# Patient Record
Sex: Female | Born: 2000 | Hispanic: Yes | Marital: Married | Smoking: Never smoker
Health system: Southern US, Community
[De-identification: ages and names within clinical notes are randomized; demographics above are authoritative.]

## PROBLEM LIST (undated history)

## (undated) DIAGNOSIS — E119 Type 2 diabetes mellitus without complications: Secondary | ICD-10-CM

## (undated) DIAGNOSIS — I10 Essential (primary) hypertension: Secondary | ICD-10-CM

---

## 2019-06-01 ENCOUNTER — Other Ambulatory Visit: Payer: Self-pay

## 2019-06-01 ENCOUNTER — Emergency Department (HOSPITAL_BASED_OUTPATIENT_CLINIC_OR_DEPARTMENT_OTHER)
Admission: EM | Admit: 2019-06-01 | Discharge: 2019-06-01 | Disposition: A | Discharge status: Home / Self Care | Payer: Medicaid Other | Attending: Emergency Medicine | Admitting: Emergency Medicine

## 2019-06-01 ENCOUNTER — Encounter (HOSPITAL_BASED_OUTPATIENT_CLINIC_OR_DEPARTMENT_OTHER): Payer: Self-pay | Admitting: Emergency Medicine

## 2019-06-01 ENCOUNTER — Emergency Department (HOSPITAL_BASED_OUTPATIENT_CLINIC_OR_DEPARTMENT_OTHER): Payer: Medicaid Other

## 2019-06-01 DIAGNOSIS — Z20828 Contact with and (suspected) exposure to other viral communicable diseases: Secondary | ICD-10-CM | POA: Insufficient documentation

## 2019-06-01 DIAGNOSIS — Z20822 Contact with and (suspected) exposure to covid-19: Secondary | ICD-10-CM

## 2019-06-01 DIAGNOSIS — R0602 Shortness of breath: Secondary | ICD-10-CM

## 2019-06-01 DIAGNOSIS — B349 Viral infection, unspecified: Secondary | ICD-10-CM | POA: Insufficient documentation

## 2019-06-01 LAB — BASIC METABOLIC PANEL
Anion gap: 8 (ref 5–15)
BUN: 12 mg/dL (ref 6–20)
CO2: 23 mmol/L (ref 22–32)
Calcium: 9.1 mg/dL (ref 8.9–10.3)
Chloride: 107 mmol/L (ref 98–111)
Creatinine, Ser: 0.63 mg/dL (ref 0.44–1.00)
GFR calc Af Amer: >60 mL/min (ref >60)
GFR calc non Af Amer: >60 mL/min (ref >60)
Glucose, Bld: 89 mg/dL (ref 70–99)
Potassium: 3.8 mmol/L (ref 3.5–5.1)
Sodium: 138 mmol/L (ref 135–145)

## 2019-06-01 LAB — CBC WITH DIFFERENTIAL/PLATELET
Abs Immature Granulocytes: 0.04 10*3/uL (ref 0.00–0.07)
Basophils Absolute: 0 10*3/uL (ref 0.0–0.1)
Basophils Relative: 1 %
Eosinophils Absolute: 0.2 10*3/uL (ref 0.0–0.5)
Eosinophils Relative: 2 %
HCT: 39.5 % (ref 36.0–46.0)
Hemoglobin: 12.9 g/dL (ref 12.0–15.0)
Immature Granulocytes: 1 %
Lymphocytes Relative: 31 %
Lymphs Abs: 2.7 10*3/uL (ref 0.7–4.0)
MCH: 30.7 pg (ref 26.0–34.0)
MCHC: 32.7 g/dL (ref 30.0–36.0)
MCV: 94 fL (ref 80.0–100.0)
Monocytes Absolute: 0.5 10*3/uL (ref 0.1–1.0)
Monocytes Relative: 6 %
Neutro Abs: 5.3 10*3/uL (ref 1.7–7.7)
Neutrophils Relative %: 59 %
Platelets: 311 10*3/uL (ref 150–400)
RBC: 4.2 MIL/uL (ref 3.87–5.11)
RDW: 12.5 % (ref 11.5–15.5)
WBC: 8.7 10*3/uL (ref 4.0–10.5)
nRBC: 0 % (ref 0.0–0.2)

## 2019-06-01 LAB — URINALYSIS, ROUTINE W REFLEX MICROSCOPIC
Bilirubin Urine: NEGATIVE
Glucose, UA: NEGATIVE mg/dL
Hgb urine dipstick: NEGATIVE
Ketones, ur: NEGATIVE mg/dL
Leukocytes,Ua: NEGATIVE
Nitrite: NEGATIVE
Protein, ur: NEGATIVE mg/dL
Specific Gravity, Urine: 1.02 (ref 1.005–1.030)
pH: 7 (ref 5.0–8.0)

## 2019-06-01 LAB — SARS CORONAVIRUS 2 AG (30 MIN TAT): SARS Coronavirus 2 Ag: NEGATIVE

## 2019-06-01 LAB — PREGNANCY, URINE: Preg Test, Ur: NEGATIVE

## 2019-06-01 LAB — INFLUENZA PANEL BY PCR (TYPE A & B)
Influenza A By PCR: NEGATIVE
Influenza B By PCR: NEGATIVE

## 2019-06-01 NOTE — Discharge Instructions (Addendum)
You were seen in the emergency department for a few days of feeling unwell with shortness of breath body aches ringing in the ears fatigue chest pain.  You had blood work chest x-ray EKG that did not show any serious findings.  Your Covid testing was pending at time of discharge along with flu testing.  You will need to isolate until these tests are back and negative.  If either tests are positive you will need to isolate longer.

## 2019-06-01 NOTE — ED Triage Notes (Signed)
Generalized weakness, headache, "feels hot" today. States she feels like she did when she had covid earlier this year.

## 2019-06-01 NOTE — ED Provider Notes (Signed)
[facility] EMERGENCY DEPARTMENT Provider Note   CSN: [id number] Arrival date & time: 06/01/19  1157     History Chief Complaint  Patient presents with  . Weakness    Amanda Madden is a 18 y.o. female.  She is complaining of a few days of feeling not well.  Headache body aches cough shortness of breath chest pain ringing in the ears.  She said she had Covid earlier in the year.  Last menstrual period 1 month ago.  Boyfriend with acute illness also being evaluated in the emergency department.  The history is provided by the patient.  Influenza Presenting symptoms: cough, fatigue, fever, headache, myalgias and shortness of breath   Presenting symptoms: no diarrhea, no nausea, no rhinorrhea, no sore throat and no vomiting   Severity:  Moderate Onset quality:  Gradual Progression:  Unchanged Chronicity:  New Relieved by:  Nothing Worsened by:  Nothing Ineffective treatments:  None tried Associated symptoms: no chills, no mental status change and no neck stiffness   Risk factors: sick contacts        History reviewed. No pertinent past medical history.  There are no problems to display for this patient.   History reviewed. No pertinent surgical history.   OB History   No obstetric history on file.     No family history on file.  Social History   Tobacco Use  . Smoking status: Never Smoker  . Smokeless tobacco: Never Used  Substance Use Topics  . Alcohol use: Not Currently  . Drug use: Never    Home Medications Prior to Admission medications   Not on File    Allergies    Patient has no known allergies.  Review of Systems   Review of Systems  Constitutional: Positive for fatigue and fever. Negative for chills.  HENT: Positive for tinnitus. Negative for rhinorrhea and sore throat.   Eyes: Negative for visual disturbance.  Respiratory: Positive for cough and shortness of breath.   Cardiovascular: Positive for chest pain.  Gastrointestinal:  Negative for abdominal pain, diarrhea, nausea and vomiting.  Genitourinary: Negative for dysuria.  Musculoskeletal: Positive for myalgias. Negative for neck stiffness.  Skin: Negative for rash.  Neurological: Positive for headaches.    Physical Exam Updated Vital Signs BP 111/65 (BP Location: Left Arm)   Pulse 79   Temp 99.2 F (37.3 C) (Oral)   Resp 16   Ht 5\' 8"  (1.727 m)   Wt 113.4 kg   LMP 05/26/2019   SpO2 99%   BMI 38.01 kg/m   Physical Exam Vitals and nursing note reviewed.  Constitutional:      General: She is not in acute distress.    Appearance: She is well-developed.  HENT:     Head: Normocephalic and atraumatic.     Right Ear: Tympanic membrane, ear canal and external ear normal.     Left Ear: Tympanic membrane, ear canal and external ear normal.  Eyes:     Conjunctiva/sclera: Conjunctivae normal.  Cardiovascular:     Rate and Rhythm: Normal rate and regular rhythm.     Heart sounds: No murmur.  Pulmonary:     Effort: Pulmonary effort is normal. No respiratory distress.     Breath sounds: Normal breath sounds.  Abdominal:     Palpations: Abdomen is soft.     Tenderness: There is no abdominal tenderness.  Musculoskeletal:        General: Normal range of motion.     Cervical back: Neck supple.  Right lower leg: No edema.     Left lower leg: No edema.  Skin:    General: Skin is warm and dry.     Capillary Refill: Capillary refill takes less than 2 seconds.  Neurological:     General: No focal deficit present.     Mental Status: She is alert.     Sensory: No sensory deficit.     Motor: No weakness.     ED Results / Procedures / Treatments   Labs (all labs ordered are listed, but only abnormal results are displayed) Labs Reviewed  SARS CORONAVIRUS 2 AG (30 MIN TAT)  NOVEL CORONAVIRUS, NAA (HOSP ORDER, SEND-OUT TO REF LAB; TAT 18-24 HRS)  BASIC METABOLIC PANEL  CBC WITH DIFFERENTIAL/PLATELET  URINALYSIS, ROUTINE W REFLEX MICROSCOPIC    PREGNANCY, URINE  INFLUENZA PANEL BY PCR (TYPE A & B)    EKG EKG Interpretation  Date/Time:  Saturday June 01 2019 14:09:54 EST Ventricular Rate:  72 PR Interval:    QRS Duration: 93 QT Interval:  394 QTC Calculation: 432 R Axis:   101 Text Interpretation: Sinus arrhythmia Borderline right axis deviation Borderline T abnormalities, anterior leads No old tracing to compare Confirmed by Aletta Edouard [phone]) on 06/01/2019 2:13:03 PM   Radiology DG Chest Port 1 View  Result Date: 06/01/2019 CLINICAL DATA:  Cough. EXAM: PORTABLE CHEST 1 VIEW COMPARISON:  None. FINDINGS: 1429 hours. The lungs are clear without focal pneumonia, edema, pneumothorax or pleural effusion. The cardiopericardial silhouette is within normal limits for size. The visualized bony structures of the thorax are intact. Telemetry leads overlie the chest. IMPRESSION: No active disease. Electronically Signed   By: Misty Stanley M.D.   On: 06/01/2019 14:46    Procedures Procedures (including critical care time)  Medications Ordered in ED Medications - No data to display  ED Course  I have reviewed the triage vital signs and the nursing notes.  Pertinent labs & imaging results that were available during my care of the patient were reviewed by me and considered in my medical decision making (see chart for details).  Clinical Course as of May 31 1710  Sat May 31, 6761  3254 18 year old female here with multiple symptoms including headache body aches fever shortness of breath cough chest pain abdominal pain.  Low-grade temp here otherwise unremarkable vital signs.  Benign exam.  Has close sick contact also here getting evaluated.  Differential includes flu, Covid, viral syndrome, UTI, pneumonia.  EKG unremarkable.  Chest x-ray reviewed by me no gross infiltrates awaiting radiology reading.   [MB]    Clinical Course User Index [MB] Amanda Rasmussen, MD   MDM Rules/Calculators/A&P                     Amanda Madden was evaluated in Emergency Department on 06/01/2019 for the symptoms described in the history of present illness. She was evaluated in the context of the global COVID-19 pandemic, which necessitated consideration that the patient might be at risk for infection with the SARS-CoV-2 virus that causes COVID-19. Institutional protocols and algorithms that pertain to the evaluation of patients at risk for COVID-19 are in a state of rapid change based on information released by regulatory bodies including the CDC and federal and state organizations. These policies and algorithms were followed during the patient's care in the ED.  Final Clinical Impression(s) / ED Diagnoses Final diagnoses:  Acute viral syndrome  SOB (shortness of breath)  Person under investigation for COVID-19  Rx / DC Orders ED Discharge Orders    None       Terrilee Files, MD 06/01/19 1711

## 2019-06-03 LAB — NOVEL CORONAVIRUS, NAA (HOSP ORDER, SEND-OUT TO REF LAB; TAT 18-24 HRS): SARS-CoV-2, NAA: NOT DETECTED

## 2019-06-04 ENCOUNTER — Telehealth (HOSPITAL_COMMUNITY): Payer: Self-pay

## 2020-08-27 ENCOUNTER — Emergency Department (HOSPITAL_BASED_OUTPATIENT_CLINIC_OR_DEPARTMENT_OTHER): Payer: Medicaid Other

## 2020-08-27 ENCOUNTER — Emergency Department (HOSPITAL_BASED_OUTPATIENT_CLINIC_OR_DEPARTMENT_OTHER)
Admission: EM | Admit: 2020-08-27 | Discharge: 2020-08-27 | Disposition: A | Discharge status: Home / Self Care | Payer: Medicaid Other | Attending: Emergency Medicine | Admitting: Emergency Medicine

## 2020-08-27 ENCOUNTER — Other Ambulatory Visit: Payer: Self-pay

## 2020-08-27 ENCOUNTER — Encounter (HOSPITAL_BASED_OUTPATIENT_CLINIC_OR_DEPARTMENT_OTHER): Payer: Self-pay

## 2020-08-27 DIAGNOSIS — B349 Viral infection, unspecified: Secondary | ICD-10-CM

## 2020-08-27 DIAGNOSIS — R7989 Other specified abnormal findings of blood chemistry: Secondary | ICD-10-CM

## 2020-08-27 DIAGNOSIS — R531 Weakness: Secondary | ICD-10-CM

## 2020-08-27 DIAGNOSIS — R7401 Elevation of levels of liver transaminase levels: Secondary | ICD-10-CM | POA: Insufficient documentation

## 2020-08-27 DIAGNOSIS — R69 Illness, unspecified: Secondary | ICD-10-CM | POA: Diagnosis not present

## 2020-08-27 DIAGNOSIS — E119 Type 2 diabetes mellitus without complications: Secondary | ICD-10-CM | POA: Diagnosis not present

## 2020-08-27 DIAGNOSIS — Z7984 Long term (current) use of oral hypoglycemic drugs: Secondary | ICD-10-CM | POA: Insufficient documentation

## 2020-08-27 DIAGNOSIS — R109 Unspecified abdominal pain: Secondary | ICD-10-CM | POA: Insufficient documentation

## 2020-08-27 DIAGNOSIS — Z20822 Contact with and (suspected) exposure to covid-19: Secondary | ICD-10-CM | POA: Insufficient documentation

## 2020-08-27 DIAGNOSIS — R5383 Other fatigue: Secondary | ICD-10-CM | POA: Diagnosis present

## 2020-08-27 HISTORY — DX: Type 2 diabetes mellitus without complications: E11.9

## 2020-08-27 LAB — COMPREHENSIVE METABOLIC PANEL
ALT: 192 U/L — ABNORMAL HIGH (ref 0–44)
AST: 133 U/L — ABNORMAL HIGH (ref 15–41)
Albumin: 3.7 g/dL (ref 3.5–5.0)
Alkaline Phosphatase: 51 U/L (ref 38–126)
Anion gap: 11 (ref 5–15)
BUN: 8 mg/dL (ref 6–20)
CO2: 21 mmol/L — ABNORMAL LOW (ref 22–32)
Calcium: 9.2 mg/dL (ref 8.9–10.3)
Chloride: 102 mmol/L (ref 98–111)
Creatinine, Ser: 0.89 mg/dL (ref 0.44–1.00)
GFR, Estimated: >60 mL/min (ref >60)
Glucose, Bld: 107 mg/dL — ABNORMAL HIGH (ref 70–99)
Potassium: 3.5 mmol/L (ref 3.5–5.1)
Sodium: 134 mmol/L — ABNORMAL LOW (ref 135–145)
Total Bilirubin: 0.2 mg/dL — ABNORMAL LOW (ref 0.3–1.2)
Total Protein: 8.6 g/dL — ABNORMAL HIGH (ref 6.5–8.1)

## 2020-08-27 LAB — URINALYSIS, ROUTINE W REFLEX MICROSCOPIC
Bilirubin Urine: NEGATIVE
Glucose, UA: NEGATIVE mg/dL
Ketones, ur: 15 mg/dL — AB
Leukocytes,Ua: NEGATIVE
Nitrite: NEGATIVE
Protein, ur: NEGATIVE mg/dL
Specific Gravity, Urine: 1.015 (ref 1.005–1.030)
pH: 6 (ref 5.0–8.0)

## 2020-08-27 LAB — URINALYSIS, MICROSCOPIC (REFLEX)

## 2020-08-27 LAB — RESP PANEL BY RT-PCR (FLU A&B, COVID) ARPGX2
Influenza A by PCR: NEGATIVE
Influenza B by PCR: NEGATIVE
SARS Coronavirus 2 by RT PCR: NEGATIVE

## 2020-08-27 LAB — CBC WITH DIFFERENTIAL/PLATELET
Abs Immature Granulocytes: 0.04 10*3/uL (ref 0.00–0.07)
Basophils Absolute: 0 10*3/uL (ref 0.0–0.1)
Basophils Relative: 0 %
Eosinophils Absolute: 0 10*3/uL (ref 0.0–0.5)
Eosinophils Relative: 0 %
HCT: 35.2 % — ABNORMAL LOW (ref 36.0–46.0)
Hemoglobin: 11.9 g/dL — ABNORMAL LOW (ref 12.0–15.0)
Immature Granulocytes: 1 %
Lymphocytes Relative: 16 %
Lymphs Abs: 1.2 10*3/uL (ref 0.7–4.0)
MCH: 30.4 pg (ref 26.0–34.0)
MCHC: 33.8 g/dL (ref 30.0–36.0)
MCV: 89.8 fL (ref 80.0–100.0)
Monocytes Absolute: 0.9 10*3/uL (ref 0.1–1.0)
Monocytes Relative: 12 %
Neutro Abs: 5.6 10*3/uL (ref 1.7–7.7)
Neutrophils Relative %: 71 %
Platelets: 309 10*3/uL (ref 150–400)
RBC: 3.92 MIL/uL (ref 3.87–5.11)
RDW: 13.3 % (ref 11.5–15.5)
WBC: 7.8 10*3/uL (ref 4.0–10.5)
nRBC: 0 % (ref 0.0–0.2)

## 2020-08-27 LAB — LIPASE, BLOOD: Lipase: 27 U/L (ref 11–51)

## 2020-08-27 LAB — CBG MONITORING, ED: Glucose-Capillary: 103 mg/dL — ABNORMAL HIGH (ref 70–99)

## 2020-08-27 LAB — PREGNANCY, URINE: Preg Test, Ur: NEGATIVE

## 2020-08-27 LAB — LACTIC ACID, PLASMA: Lactic Acid, Venous: 1.3 mmol/L (ref 0.5–1.9)

## 2020-08-27 MED ORDER — ACETAMINOPHEN 325 MG PO TABS
650.0000 mg | ORAL_TABLET | Freq: Once | ORAL | Status: AC
  Administered 2020-08-27: 650 mg via ORAL
  Filled 2020-08-27: qty 2

## 2020-08-27 MED ORDER — SODIUM CHLORIDE 0.9 % IV BOLUS
1000.0000 mL | Freq: Once | INTRAVENOUS | Status: AC
  Administered 2020-08-27: 1000 mL via INTRAVENOUS

## 2020-08-27 MED ORDER — ONDANSETRON HCL 4 MG/2ML IJ SOLN
4.0000 mg | Freq: Once | INTRAMUSCULAR | Status: AC
  Administered 2020-08-27: 4 mg via INTRAVENOUS
  Filled 2020-08-27: qty 2

## 2020-08-27 MED ORDER — ONDANSETRON 4 MG PO TBDP: 4.0000 mg | ORAL_TABLET | Freq: Three times a day (TID) | ORAL | 0 refills | Status: AC | PRN

## 2020-08-27 NOTE — ED Provider Notes (Signed)
[facility] EMERGENCY DEPARTMENT Provider Note   CSN: [id number] Arrival date & time: 08/27/20  1134     History Chief Complaint  Patient presents with  . Fatigue  . Pain    Amanda Madden is a 20 y.o. female with PMHx diabetes who presents to the ED today with complaints of generalized fatigue for the past couple of days.  Per chart review patient was at [facility] ED yesterday with complaints of fatigue, body aches, headache, chest pain, shortness of breath, dizziness.  She had a full work-up done at that time including a CTA of her chest which did not show any findings to suggest PE.  CT abdomen and pelvis without acute findings as well as a pelvic ultrasound without acute findings.  She was discharged home at that time.  Husband reports that patient was in the shower today when he went and checked on her and she seemed much more fatigued than normal and just generally unwell prompting him to bring her back to the ED.  He reports that she began feeling bad yesterday.  They took an at home Covid test which was negative and then decided to go get their Moderna vaccines for the first time yesterday.  Patient had a low-grade fever yesterday however is febrile today at 103.1.  Patient continues to complain of nausea however no vomiting.  No diarrhea, last normal bowel movement earlier today.   The history is provided by the patient, the spouse and medical records.       Past Medical History:  Diagnosis Date  . Diabetes mellitus without complication (HCC)     There are no problems to display for this patient.   History reviewed. No pertinent surgical history.   OB History   No obstetric history on file.     History reviewed. No pertinent family history.  Social History   Tobacco Use  . Smoking status: Never Smoker  Substance Use Topics  . Alcohol use: Never  . Drug use: Never    Home Medications Prior to Admission medications   Medication Sig Start Date End  Date Taking? Authorizing Provider  metFORMIN (GLUCOPHAGE) 500 MG tablet Take by mouth daily with breakfast.   Yes [provider]  ondansetron (ZOFRAN ODT) 4 MG disintegrating tablet Take 1 tablet (4 mg total) by mouth every 8 (eight) hours as needed for nausea or vomiting. 08/27/20  Yes Tanda Rockers, PA-C    Allergies    Patient has no known allergies.  Review of Systems   Review of Systems  Constitutional: Positive for chills, fatigue and fever.  Respiratory: Positive for cough and shortness of breath.   Gastrointestinal: Positive for abdominal pain and nausea.  Neurological: Positive for weakness (generalized weakness) and headaches.  All other systems reviewed and are negative.   Physical Exam Updated Vital Signs BP 122/84 (BP Location: Left Arm)   Pulse (!) 122   Temp (!) 103.1 F (39.5 C) (Oral)   Resp 16   Ht 5\' 8"  (1.727 m)   Wt 116.1 kg   LMP 08/23/2020   SpO2 98%   BMI 38.92 kg/m   Physical Exam Vitals and nursing note reviewed.  Constitutional:      Appearance: She is obese. She is ill-appearing. She is not diaphoretic.     Comments: Appears ill and fatigued. Speaking very softly/faintly however A&O x 4  HENT:     Head: Normocephalic and atraumatic.  Eyes:     Extraocular Movements: Extraocular movements intact.  Conjunctiva/sclera: Conjunctivae normal.     Pupils: Pupils are equal, round, and reactive to light.  Cardiovascular:     Rate and Rhythm: Normal rate and regular rhythm.     Pulses: Normal pulses.  Pulmonary:     Effort: Pulmonary effort is normal.     Breath sounds: Normal breath sounds. No wheezing, rhonchi or rales.  Abdominal:     Palpations: Abdomen is soft.     Tenderness: There is abdominal tenderness. There is no guarding or rebound.     Comments: Soft, diffuse abdominal TTP, +BS throughout, no r/g/r, neg murphy's, neg mcburney's, no CVA TTP  Musculoskeletal:     Cervical back: Neck supple.  Skin:    General: Skin is  warm and dry.  Neurological:     Mental Status: She is alert.     ED Results / Procedures / Treatments   Labs (all labs ordered are listed, but only abnormal results are displayed) Labs Reviewed  COMPREHENSIVE METABOLIC PANEL - Abnormal; Notable for the following components:      Result Value   Sodium 134 (*)    CO2 21 (*)    Glucose, Bld 107 (*)    Total Protein 8.6 (*)    AST 133 (*)    ALT 192 (*)    Total Bilirubin 0.2 (*)    All other components within normal limits  CBC WITH DIFFERENTIAL/PLATELET - Abnormal; Notable for the following components:   Hemoglobin 11.9 (*)    HCT 35.2 (*)    All other components within normal limits  URINALYSIS, ROUTINE W REFLEX MICROSCOPIC - Abnormal; Notable for the following components:   Hgb urine dipstick SMALL (*)    Ketones, ur 15 (*)    All other components within normal limits  URINALYSIS, MICROSCOPIC (REFLEX) - Abnormal; Notable for the following components:   Bacteria, UA FEW (*)    All other components within normal limits  CBG MONITORING, ED - Abnormal; Notable for the following components:   Glucose-Capillary 103 (*)    All other components within normal limits  RESP PANEL BY RT-PCR (FLU A&B, COVID) ARPGX2  LACTIC ACID, PLASMA  PREGNANCY, URINE  LIPASE, BLOOD    EKG EKG Interpretation  Date/Time:  Thursday August 27 2020 11:45:35 EDT Ventricular Rate:  117 PR Interval:    QRS Duration: 87 QT Interval:  291 QTC Calculation: 406 R Axis:   117 Text Interpretation: Sinus tachycardia Probable right ventricular hypertrophy Borderline T abnormalities, diffuse leads No previous ECGs available Confirmed by Vanetta Mulders [phone]) on 08/27/2020 1:48:20 PM   Radiology DG Chest Portable 1 View  Result Date: 08/27/2020 CLINICAL DATA:  Short of breath and fever EXAM: PORTABLE CHEST 1 VIEW COMPARISON:  None. FINDINGS: The heart size and mediastinal contours are within normal limits. Both lungs are clear. The visualized skeletal  structures are unremarkable. IMPRESSION: No active disease. Electronically Signed   By: Marlan Palau M.D.   On: 08/27/2020 12:51   US Abdomen Limited RUQ (LIVER/GB)  Result Date: 08/27/2020 CLINICAL DATA:  Nausea and vomiting with abdominal pain x3 days EXAM: ULTRASOUND ABDOMEN LIMITED RIGHT UPPER QUADRANT COMPARISON:  None. FINDINGS: Gallbladder: No gallstones or wall thickening visualized. No sonographic Murphy sign noted by sonographer. Common bile duct: Diameter: 2 mm Liver: No focal lesion identified. Within normal limits in parenchymal echogenicity. Portal vein is patent on color Doppler imaging with normal direction of blood flow towards the liver. Other: None. IMPRESSION: Unremarkable right upper quadrant ultrasound. Electronically Signed  By: Maudry Mayhew MD   On: 08/27/2020 16:16    Procedures Procedures   Medications Ordered in ED Medications  acetaminophen (TYLENOL) tablet 650 mg (650 mg Oral Given 08/27/20 1256)  ondansetron (ZOFRAN) injection 4 mg (4 mg Intravenous Given 08/27/20 1241)  sodium chloride 0.9 % bolus 1,000 mL (0 mLs Intravenous Stopped 08/27/20 1357)    ED Course  I have reviewed the triage vital signs and the nursing notes.  Pertinent labs & imaging results that were available during my care of the patient were reviewed by me and considered in my medical decision making (see chart for details).    MDM Rules/Calculators/A&P                          20 year old female who presents to the ED today with complaint of generalized fatigue and weakness for the past 2 days.  She was feeling ill yesterday however then decided to go and receive her first maternal vaccine.  She went to [facility] ED yesterday and had a full work-up including lab work as well as troponin, D-dimer, CBC, lipase, CMP, CTA chest, CT abdomen and pelvis, pelvic ultrasound which were all negative.  She began feeling more ill today and seemed "altered" to her husband prompting him to bring her to  the ED today.  She was found to be febrile at 103.1 as well as tachycardic at 127.  Nontachypneic.  Blood pressure stable at 128/87 and satting 100% on room air.  She is alert and oriented x4 on my exam.  Equal strength to BUE and BLEs. She does appear very fatigued and is speaking in short sentences.  She is speaking very softly and needs to be prompted to be able to talk otherwise her spouse will talk over her.  She denies any sore throat or difficulty swallowing.  She has diffuse abdominal tenderness palpation however does state her body hurts all over.  She has had some nausea.  No vomiting or diarrhea.  Will provide Tylenol and fluids for symptomatic relief at this time.  We will plan to swab for Covid.  Will obtain lab work as well to assess for any change since her ED visit yesterday.  Chest x-ray was ordered prior to being seen which does not show any acute findings.  CBC without leukocytosis at 7.8.  Hemoglobin stable at 11.9. CMP with a sodium of 134.  Glucose 107, bicarb 21.  No gap.  AST and ALT are significantly elevated today at 133/192. (122/82 yesterday).  Patient if she is having LFT elevation secondary to possible Covid infection.  There were no gallstones appreciated on her CT abdomen and pelvis yesterday. Lipase not ordered initially, will plan to obtain today. Lactic acid within normal limits at 1.3.  Covid and flu test have all returned negative.  Given elevated LFTs compared to yesterday with fever will plan for right upper quadrant ultrasound to assess for any gallbladder etiology.  Concern for possible acute cholecystitis at this time.   RUQ ultrasound negative for acute findings  Remainder of workup reassuring. I am still suspicious for COVID if though her test is negative. She may also have a viral infection compounded ontop of vaccine reaction from yesterday. Advised pt to self isolate for the next 5 days and to follow up with PCP for same. Will discharge with zofran for  nausea. Have encouraged increasing water intake to stay hydrated and to take Tylenol as needed for fevers. Pt  instructed to return to the ED for any worsening symptoms. She is in agreement with plan and stable for discharge home.   This note was prepared using Dragon voice recognition software and may include unintentional dictation errors due to the inherent limitations of voice recognition software.  Amanda Madden Emley was evaluated in Emergency Department on 08/27/2020 for the symptoms described in the history of present illness. She was evaluated in the context of the global COVID-19 pandemic, which necessitated consideration that the patient might be at risk for infection with the SARS-CoV-2 virus that causes COVID-19. Institutional protocols and algorithms that pertain to the evaluation of patients at risk for COVID-19 are in a state of rapid change based on information released by regulatory bodies including the CDC and federal and state organizations. These policies and algorithms were followed during the patient's care in the ED.   Final Clinical Impression(s) / ED Diagnoses Final diagnoses:  Elevated LFTs  Generalized weakness  Viral illness    Rx / DC Orders ED Discharge Orders         Ordered    ondansetron (ZOFRAN ODT) 4 MG disintegrating tablet  Every 8 hours PRN        08/27/20 1629           Discharge Instructions     Your workup was reassuring in the ED today without acute findings. Your COVID test was negative however it may be a false negative. You may also be suffering from another viral infection that is compounded by a reaction to the vaccine you received yesterday.   I would recommend self isolating at home for the next 5 days. If on Day 6 you are feeling improved and your fever has resolved without fever reducing medications you can resume normal daily activity.   Please drink plenty of fluids to stay hydrated. Take Tylenol as needed for fevers > 100.4. You can also  take Ibuprofen as needed for body aches.   Follow up with your PCP regarding your ED visit today  Return to the ED IMMEDIATELY for any worsening symptoms       Tanda RockersVenter, Molly Savarino, PA-C 08/27/20 1631    Vanetta MuldersZackowski, Scott, MD 08/29/20 [phone]

## 2020-08-27 NOTE — Discharge Instructions (Addendum)
Your workup was reassuring in the ED today without acute findings. Your COVID test was negative however it may be a false negative. You may also be suffering from another viral infection that is compounded by a reaction to the vaccine you received yesterday.   I would recommend self isolating at home for the next 5 days. If on Day 6 you are feeling improved and your fever has resolved without fever reducing medications you can resume normal daily activity.   Please drink plenty of fluids to stay hydrated. Take Tylenol as needed for fevers > 100.4. You can also take Ibuprofen as needed for body aches.   Follow up with your PCP regarding your ED visit today  Return to the ED IMMEDIATELY for any worsening symptoms

## 2020-08-27 NOTE — ED Triage Notes (Signed)
Pt arrives with husband who states she "hasnt been feeling well" the past few days. Got first Moderna vaccine yesterday. Went to hospital yesterday, everything was normal. Pt unable to explain what is wrong. Husband states she has been feeling short of breath and kept falling asleep. States she feels weak.

## 2020-08-27 NOTE — ED Notes (Signed)
Pt A&O x4. Answers yes and no questions but states she is too weak to speak. Answers in 1 word sentences. Bilateral grip/legs weak, no unilateral weakness.

## 2020-10-14 ENCOUNTER — Emergency Department (HOSPITAL_COMMUNITY): Payer: Medicaid Other

## 2020-10-14 ENCOUNTER — Emergency Department (HOSPITAL_BASED_OUTPATIENT_CLINIC_OR_DEPARTMENT_OTHER): Payer: Medicaid Other

## 2020-10-14 ENCOUNTER — Emergency Department (HOSPITAL_BASED_OUTPATIENT_CLINIC_OR_DEPARTMENT_OTHER)
Admission: EM | Admit: 2020-10-14 | Discharge: 2020-10-14 | Disposition: A | Discharge status: Home / Self Care | Payer: Medicaid Other | Attending: Emergency Medicine | Admitting: Emergency Medicine

## 2020-10-14 ENCOUNTER — Other Ambulatory Visit: Payer: Self-pay

## 2020-10-14 ENCOUNTER — Encounter (HOSPITAL_BASED_OUTPATIENT_CLINIC_OR_DEPARTMENT_OTHER): Payer: Self-pay

## 2020-10-14 DIAGNOSIS — O26891 Other specified pregnancy related conditions, first trimester: Secondary | ICD-10-CM | POA: Diagnosis present

## 2020-10-14 DIAGNOSIS — B9689 Other specified bacterial agents as the cause of diseases classified elsewhere: Secondary | ICD-10-CM | POA: Insufficient documentation

## 2020-10-14 DIAGNOSIS — O368390 Maternal care for abnormalities of the fetal heart rate or rhythm, unspecified trimester, not applicable or unspecified: Secondary | ICD-10-CM

## 2020-10-14 DIAGNOSIS — R102 Pelvic and perineal pain: Secondary | ICD-10-CM

## 2020-10-14 DIAGNOSIS — O368310 Maternal care for abnormalities of the fetal heart rate or rhythm, first trimester, not applicable or unspecified: Secondary | ICD-10-CM | POA: Insufficient documentation

## 2020-10-14 DIAGNOSIS — O24111 Pre-existing diabetes mellitus, type 2, in pregnancy, first trimester: Secondary | ICD-10-CM | POA: Diagnosis not present

## 2020-10-14 DIAGNOSIS — Z7984 Long term (current) use of oral hypoglycemic drugs: Secondary | ICD-10-CM | POA: Diagnosis not present

## 2020-10-14 DIAGNOSIS — O23591 Infection of other part of genital tract in pregnancy, first trimester: Secondary | ICD-10-CM | POA: Insufficient documentation

## 2020-10-14 DIAGNOSIS — O0001 Abdominal pregnancy with intrauterine pregnancy: Secondary | ICD-10-CM | POA: Insufficient documentation

## 2020-10-14 DIAGNOSIS — Z3A00 Weeks of gestation of pregnancy not specified: Secondary | ICD-10-CM | POA: Insufficient documentation

## 2020-10-14 DIAGNOSIS — Z3490 Encounter for supervision of normal pregnancy, unspecified, unspecified trimester: Secondary | ICD-10-CM

## 2020-10-14 LAB — CBC WITH DIFFERENTIAL/PLATELET
Abs Immature Granulocytes: 0.03 10*3/uL (ref 0.00–0.07)
Basophils Absolute: 0 10*3/uL (ref 0.0–0.1)
Basophils Relative: 0 %
Eosinophils Absolute: 0.4 10*3/uL (ref 0.0–0.5)
Eosinophils Relative: 4 %
HCT: 38.3 % (ref 36.0–46.0)
Hemoglobin: 12.5 g/dL (ref 12.0–15.0)
Immature Granulocytes: 0 %
Lymphocytes Relative: 31 %
Lymphs Abs: 2.7 10*3/uL (ref 0.7–4.0)
MCH: 29.6 pg (ref 26.0–34.0)
MCHC: 32.6 g/dL (ref 30.0–36.0)
MCV: 90.5 fL (ref 80.0–100.0)
Monocytes Absolute: 0.6 10*3/uL (ref 0.1–1.0)
Monocytes Relative: 7 %
Neutro Abs: 5 10*3/uL (ref 1.7–7.7)
Neutrophils Relative %: 58 %
Platelets: 435 10*3/uL — ABNORMAL HIGH (ref 150–400)
RBC: 4.23 MIL/uL (ref 3.87–5.11)
RDW: 13.8 % (ref 11.5–15.5)
WBC: 8.8 10*3/uL (ref 4.0–10.5)
nRBC: 0 % (ref 0.0–0.2)

## 2020-10-14 LAB — COMPREHENSIVE METABOLIC PANEL
ALT: 20 U/L (ref 0–44)
AST: 18 U/L (ref 15–41)
Albumin: 3.8 g/dL (ref 3.5–5.0)
Alkaline Phosphatase: 54 U/L (ref 38–126)
Anion gap: 9 (ref 5–15)
BUN: 9 mg/dL (ref 6–20)
CO2: 23 mmol/L (ref 22–32)
Calcium: 9.2 mg/dL (ref 8.9–10.3)
Chloride: 103 mmol/L (ref 98–111)
Creatinine, Ser: 0.63 mg/dL (ref 0.44–1.00)
GFR, Estimated: >60 mL/min (ref >60)
Glucose, Bld: 91 mg/dL (ref 70–99)
Potassium: 3.5 mmol/L (ref 3.5–5.1)
Sodium: 135 mmol/L (ref 135–145)
Total Bilirubin: 0.3 mg/dL (ref 0.3–1.2)
Total Protein: 8.1 g/dL (ref 6.5–8.1)

## 2020-10-14 LAB — URINALYSIS, ROUTINE W REFLEX MICROSCOPIC
Bilirubin Urine: NEGATIVE
Glucose, UA: NEGATIVE mg/dL
Hgb urine dipstick: NEGATIVE
Ketones, ur: NEGATIVE mg/dL
Leukocytes,Ua: NEGATIVE
Nitrite: NEGATIVE
Protein, ur: NEGATIVE mg/dL
Specific Gravity, Urine: 1.015 (ref 1.005–1.030)
pH: 7.5 (ref 5.0–8.0)

## 2020-10-14 LAB — WET PREP, GENITAL
Sperm: NONE SEEN
Trich, Wet Prep: NONE SEEN
Yeast Wet Prep HPF POC: NONE SEEN

## 2020-10-14 LAB — HCG, QUANTITATIVE, PREGNANCY: hCG, Beta Chain, Quant, S: 15377 m[IU]/mL — ABNORMAL HIGH (ref <5)

## 2020-10-14 MED ORDER — METRONIDAZOLE 500 MG PO TABS: 500.0000 mg | ORAL_TABLET | Freq: Two times a day (BID) | ORAL | 0 refills | Status: DC

## 2020-10-14 MED ORDER — PRENATAL COMPLETE 14-0.4 MG PO TABS: 1.0000 | ORAL_TABLET | Freq: Every day | ORAL | 0 refills | Status: AC

## 2020-10-14 NOTE — ED Triage Notes (Signed)
Pt c/o left pelvic "ovary" pain started yesterday-n/v x 2 days-seen by PCP yesterday for n/v states did not have pelvic pain prior to PCP visit-states she is 5-[redacted] weeks pregnant-NAD-steady gait

## 2020-10-14 NOTE — Discharge Instructions (Signed)
Follow-up with OB/GYN on this matter. Return to the emergency department for persistent abdominal pain, heavy vaginal bleeding, dizziness, passing out, uncontrolled vomiting, or any other major concerns.

## 2020-10-14 NOTE — ED Provider Notes (Signed)
[facility] EMERGENCY DEPARTMENT Provider Note   CSN: [id number] Arrival date & time: 10/14/20  1400     History Chief Complaint  Patient presents with  . Pelvic Pain    5-[redacted] weeks pregnant     Amanda Madden is a 20 y.o. female.  HPI      Amanda Madden is a 20 y.o. female, with a history of DM, presenting to the ED with left lower abdominal discomfort beginning yesterday. Pain is intermittent, cramping, moderate, nonradiating.  Patient was not experiencing this pain during my assessment. Nausea and loose stools yesterday, none today. Patient had a positive pregnancy test May 1 in the ED.   Denies fever/chills, hematochezia/melena, urinary symptoms, flank/back pain, chest pain, shortness of breath, vaginal discharge/bleeding, other abdominal pain, or any other complaints.  Past Medical History:  Diagnosis Date  . Diabetes mellitus without complication (HCC)     There are no problems to display for this patient.   History reviewed. No pertinent surgical history.   OB History    Gravida  1   Para      Term      Preterm      AB      Living        SAB      IAB      Ectopic      Multiple      Live Births              No family history on file.  Social History   Tobacco Use  . Smoking status: Never Smoker  . Smokeless tobacco: Never Used  Substance Use Topics  . Alcohol use: Never  . Drug use: Never    Home Medications Prior to Admission medications   Medication Sig Start Date End Date Taking? Authorizing Provider  metroNIDAZOLE (FLAGYL) 500 MG tablet Take 1 tablet (500 mg total) by mouth 2 (two) times daily. 10/14/20  Yes Queena Monrreal C, PA-C  Prenatal Vit-Fe Fumarate-FA (PRENATAL COMPLETE) 14-0.4 MG TABS Take 1 tablet by mouth daily. 10/14/20 01/12/21 Yes Ellisa Devivo C, PA-C  metFORMIN (GLUCOPHAGE) 500 MG tablet Take by mouth daily with breakfast.    [provider]  ondansetron (ZOFRAN ODT) 4 MG disintegrating tablet Take 1  tablet (4 mg total) by mouth every 8 (eight) hours as needed for nausea or vomiting. 08/27/20   Tanda Rockers, PA-C    Allergies    Patient has no known allergies.  Review of Systems   Review of Systems  Constitutional: Negative for chills, diaphoresis and fever.  Respiratory: Negative for shortness of breath.   Cardiovascular: Negative for chest pain.  Gastrointestinal: Positive for abdominal pain, diarrhea (None today), nausea and vomiting (None today). Negative for blood in stool and constipation.  Genitourinary: Negative for dysuria, flank pain, hematuria, vaginal bleeding and vaginal discharge.  Neurological: Negative for weakness.  All other systems reviewed and are negative.   Physical Exam Updated Vital Signs BP 122/66 (BP Location: Left Arm)   Pulse 96   Temp 97.9 F (36.6 C) (Oral)   Resp 18   Ht 5\' 8"  (1.727 m)   Wt 105.7 kg   LMP 08/23/2020   SpO2 100%   BMI 35.43 kg/m   Physical Exam Vitals and nursing note reviewed.  Constitutional:      General: She is not in acute distress.    Appearance: She is well-developed. She is not diaphoretic.  HENT:     Head: Normocephalic and atraumatic.  Mouth/Throat:     Mouth: Mucous membranes are moist.     Pharynx: Oropharynx is clear.  Eyes:     Conjunctiva/sclera: Conjunctivae normal.  Cardiovascular:     Rate and Rhythm: Normal rate and regular rhythm.     Pulses: Normal pulses.  Pulmonary:     Effort: Pulmonary effort is normal. No respiratory distress.  Abdominal:     Palpations: Abdomen is soft.     Tenderness: There is no abdominal tenderness. There is no guarding.  Genitourinary:    Comments: External genitalia normal Vagina without discharge Cervix  normal negative for cervical motion tenderness Adnexa palpated, no masses, negative for tenderness noted Bladder palpated negative for tenderness Uterus palpated no masses, negative for tenderness  No inguinal lymphadenopathy. Otherwise normal female  genitalia. RN served as Biomedical engineer during exam. Musculoskeletal:     Cervical back: Neck supple.  Skin:    General: Skin is warm and dry.  Neurological:     Mental Status: She is alert.  Psychiatric:        Mood and Affect: Mood and affect normal.        Speech: Speech normal.        Behavior: Behavior normal.     ED Results / Procedures / Treatments   Labs (all labs ordered are listed, but only abnormal results are displayed) Labs Reviewed  WET PREP, GENITAL - Abnormal; Notable for the following components:      Result Value   Clue Cells Wet Prep HPF POC PRESENT (*)    WBC, Wet Prep HPF POC FEW (*)    All other components within normal limits  CBC WITH DIFFERENTIAL/PLATELET - Abnormal; Notable for the following components:   Platelets 435 (*)    All other components within normal limits  HCG, QUANTITATIVE, PREGNANCY - Abnormal; Notable for the following components:   hCG, Beta Chain, Quant, S 15,377 (*)    All other components within normal limits  URINALYSIS, ROUTINE W REFLEX MICROSCOPIC  COMPREHENSIVE METABOLIC PANEL  GC/CHLAMYDIA PROBE AMP ([place]) NOT AT [facility]    EKG None  Radiology US OB LESS THAN 14 WEEKS WITH OB TRANSVAGINAL  Result Date: 10/14/2020 CLINICAL DATA:  Pelvic pain EXAM: OBSTETRIC <14 WK [place] AND TRANSVAGINAL OB US TECHNIQUE: Both transabdominal and transvaginal ultrasound examinations were performed for complete evaluation of the gestation as well as the maternal uterus, adnexal regions, and pelvic cul-de-sac. Transvaginal technique was performed to assess early pregnancy. COMPARISON:  None. FINDINGS: Intrauterine gestational sac: Single Yolk sac:  Visualized Embryo:  Visualized Cardiac Activity: Visualized Heart Rate: 93 bpm CRL: 4 mm   6 w   1 d                  [place] EDC: 06/08/2020 Subchorionic hemorrhage:  None visualized. Maternal uterus/adnexae: Ovaries are within normal limits. No free fluid. IMPRESSION: 1. Single intrauterine pregnancy as above. 2.  Slight fetal bradycardia. Electronically Signed   By: Jasmine Pang M.D.   On: 10/14/2020 17:54    Procedures Procedures   Medications Ordered in ED Medications - No data to display  ED Course  I have reviewed the triage vital signs and the nursing notes.  Pertinent labs & imaging results that were available during my care of the patient were reviewed by me and considered in my medical decision making (see chart for details).    MDM Rules/Calculators/A&P  Patient presents complaining of left lower quadrant and pelvic pain. Patient is nontoxic appearing, afebrile, not tachycardic, not tachypneic, not hypotensive, maintains excellent SPO2 on room air, and is in no apparent distress.   I have reviewed the patient's chart to obtain more information.   I reviewed and interpreted the patient's labs and radiological studies. She did not have any pain during her entire ED course.  Multiple abdominal exams benign. Patient's hCG level was around 700 on my first, over 1500 on May 4. IUP confirmed on ultrasound.  Fetal bradycardia noted.  Patient follows with [facility] and has an initial pregnancy appointment June 1.  She was advised to contact the OB/GYN office to communicate the finding of fetal bradycardia. Clue cells on wet prep.  Per current guidelines, pregnant patients should be treated for BV.  The patient was given instructions for home care as well as return precautions. Patient voices understanding of these instructions, accepts the plan, and is comfortable with discharge.  Findings and plan of care discussed with attending physician, Cathi Roan, MD    Vitals:   10/14/20 1650 10/14/20 1700 10/14/20 1811 10/14/20 1917  BP: 112/69 116/69 116/89 121/78  Pulse: 80 72 80 82  Resp: 18 20 18 18   Temp:      TempSrc:      SpO2: 100% 100% 100% 100%  Weight:      Height:       .  Final Clinical Impression(s) / ED Diagnoses Final diagnoses:   Intrauterine pregnancy  Bradycardia found on measurement of baseline fetal heart rate  BV (bacterial vaginosis)    Rx / DC Orders ED Discharge Orders         Ordered    metroNIDAZOLE (FLAGYL) 500 MG tablet  2 times daily        10/14/20 1936    Prenatal Vit-Fe Fumarate-FA (PRENATAL COMPLETE) 14-0.4 MG TABS  Daily        10/14/20 1939           12/14/20 10/14/20 2008    Long, Joshua G, MD 10/19/20 [phone]

## 2020-10-14 NOTE — ED Notes (Signed)
Patient transported to Ultrasound 

## 2020-10-16 LAB — GC/CHLAMYDIA PROBE AMP (~~LOC~~) NOT AT ARMC
Chlamydia: NEGATIVE
Comment: NEGATIVE
Comment: NORMAL
Neisseria Gonorrhea: NEGATIVE

## 2021-05-17 ENCOUNTER — Inpatient Hospital Stay (HOSPITAL_COMMUNITY)
Admission: AD | Admit: 2021-05-17 | Discharge: 2021-05-17 | Disposition: A | Discharge status: Home / Self Care | Payer: Medicaid Other | Attending: Obstetrics & Gynecology | Admitting: Obstetrics & Gynecology

## 2021-05-17 ENCOUNTER — Other Ambulatory Visit: Payer: Self-pay

## 2021-05-17 ENCOUNTER — Encounter (HOSPITAL_COMMUNITY): Payer: Self-pay | Admitting: Obstetrics & Gynecology

## 2021-05-17 DIAGNOSIS — Z3493 Encounter for supervision of normal pregnancy, unspecified, third trimester: Secondary | ICD-10-CM

## 2021-05-17 DIAGNOSIS — Z3A36 36 weeks gestation of pregnancy: Secondary | ICD-10-CM | POA: Diagnosis not present

## 2021-05-17 DIAGNOSIS — O368130 Decreased fetal movements, third trimester, not applicable or unspecified: Secondary | ICD-10-CM | POA: Diagnosis present

## 2021-05-17 DIAGNOSIS — E162 Hypoglycemia, unspecified: Secondary | ICD-10-CM

## 2021-05-17 DIAGNOSIS — O10913 Unspecified pre-existing hypertension complicating pregnancy, third trimester: Secondary | ICD-10-CM | POA: Insufficient documentation

## 2021-05-17 DIAGNOSIS — O24113 Pre-existing diabetes mellitus, type 2, in pregnancy, third trimester: Secondary | ICD-10-CM | POA: Insufficient documentation

## 2021-05-17 DIAGNOSIS — O10919 Unspecified pre-existing hypertension complicating pregnancy, unspecified trimester: Secondary | ICD-10-CM

## 2021-05-17 DIAGNOSIS — E11649 Type 2 diabetes mellitus with hypoglycemia without coma: Secondary | ICD-10-CM | POA: Diagnosis not present

## 2021-05-17 LAB — URINALYSIS, ROUTINE W REFLEX MICROSCOPIC
Bilirubin Urine: NEGATIVE
Glucose, UA: NEGATIVE mg/dL
Hgb urine dipstick: NEGATIVE
Ketones, ur: NEGATIVE mg/dL
Leukocytes,Ua: NEGATIVE
Nitrite: NEGATIVE
Protein, ur: NEGATIVE mg/dL
Specific Gravity, Urine: 1.01 (ref 1.005–1.030)
pH: 6 (ref 5.0–8.0)

## 2021-05-17 LAB — COMPREHENSIVE METABOLIC PANEL
ALT: 18 U/L (ref 0–44)
AST: 33 U/L (ref 15–41)
Albumin: 2.4 g/dL — ABNORMAL LOW (ref 3.5–5.0)
Alkaline Phosphatase: 110 U/L (ref 38–126)
Anion gap: 9 (ref 5–15)
BUN: 6 mg/dL (ref 6–20)
CO2: 19 mmol/L — ABNORMAL LOW (ref 22–32)
Calcium: 9 mg/dL (ref 8.9–10.3)
Chloride: 107 mmol/L (ref 98–111)
Creatinine, Ser: 0.59 mg/dL (ref 0.44–1.00)
GFR, Estimated: >60 mL/min (ref >60)
Glucose, Bld: 115 mg/dL — ABNORMAL HIGH (ref 70–99)
Potassium: 4.1 mmol/L (ref 3.5–5.1)
Sodium: 135 mmol/L (ref 135–145)
Total Bilirubin: 0.7 mg/dL (ref 0.3–1.2)
Total Protein: 6.5 g/dL (ref 6.5–8.1)

## 2021-05-17 LAB — PROTEIN / CREATININE RATIO, URINE
Creatinine, Urine: 49.89 mg/dL
Protein Creatinine Ratio: 0.14 mg/mg{Cre} (ref 0.00–0.15)
Total Protein, Urine: 7 mg/dL

## 2021-05-17 LAB — GLUCOSE, CAPILLARY: Glucose-Capillary: 120 mg/dL — ABNORMAL HIGH (ref 70–99)

## 2021-05-17 LAB — CBC
HCT: 32.1 % — ABNORMAL LOW (ref 36.0–46.0)
Hemoglobin: 10.5 g/dL — ABNORMAL LOW (ref 12.0–15.0)
MCH: 29 pg (ref 26.0–34.0)
MCHC: 32.7 g/dL (ref 30.0–36.0)
MCV: 88.7 fL (ref 80.0–100.0)
Platelets: 254 10*3/uL (ref 150–400)
RBC: 3.62 MIL/uL — ABNORMAL LOW (ref 3.87–5.11)
RDW: 13.3 % (ref 11.5–15.5)
WBC: 7.9 10*3/uL (ref 4.0–10.5)
nRBC: 0 % (ref 0.0–0.2)

## 2021-05-17 MED ORDER — ACETAMINOPHEN 500 MG PO TABS
1000.0000 mg | ORAL_TABLET | Freq: Once | ORAL | Status: AC
  Administered 2021-05-17: 1000 mg via ORAL
  Filled 2021-05-17: qty 2

## 2021-05-17 NOTE — Discharge Instructions (Signed)
Amanda Madden w/ [facility] [address] , [place] [address] [phone] [email or web address]  

## 2021-05-17 NOTE — MAU Note (Addendum)
I was seen at [facility] hosp couple days ago due to pelvic pain and vag bleeding. This am woke up and haivng  a lot of pelvic pressure. Was 1.5cm dilated at [facility]. Baby was not moving a lot this am and ate and then took a nap. Awoke this afternoon and and was shaking so took my blood sugar and it was 53 and ate and it went up to 83. Still not feeling baby move like usual. Just feel off. Gets care in [place] but does not feel they take her seriously. Type 2 Diabetic on insulin 10u TID but does not know what type of insulin. Was just prescribed insulin couple wks ago but has not taken any at all due to low blood sugars

## 2021-05-17 NOTE — MAU Provider Note (Signed)
Chief Complaint:  Decreased Fetal Movement and Hypoglycemia   Event Date/Time   First Provider Initiated Contact with Patient 05/17/21 2123     HPI: Amanda Madden is a 20 y.o. G1P0 at [redacted]w[redacted]d who presents to maternity admissions reporting an episode of hypoglycemia earlier today (53) with decreased fetal movement, she ate something and her level came up to 83. She laid down for a nap and still noted decreased fetal movement so came in. Also reports bloody show while wiping twice today, significant pain over her pubic symphysis and difficulty turning over in bed and overall concern about her health. Has been to the hospital in [place], the one in [place] and now here because she is concerned and does not feel like anyone is taking her concerns seriously.  Has had hypertensive readings prior to and throughout pregnancy, had elevated liver enzymes prior to and several times during the pregnancy, had a headache last week bad enough to make her see black spots and has been complaining of these things to her providers but says they always tell her she's ok and send her home. Is currently getting twice weekly NSTs and has been sent in for monitoring twice for NRNST but has no plan in place for IOL.   No other physical complaints.  Pregnancy Course: Receives care from [place], prenatal records reviewed.  Past Medical History:  Diagnosis Date   Diabetes mellitus without complication ([place])    OB History  Gravida Para Term Preterm AB Living  1            SAB IAB Ectopic Multiple Live Births               # Outcome Date GA Lbr Len/2nd Weight Sex Delivery Anes PTL Lv  1 Current            History reviewed. No pertinent surgical history. History reviewed. No pertinent family history. Social History   Tobacco Use   Smoking status: Never   Smokeless tobacco: Never  Substance Use Topics   Alcohol use: Never   Drug use: Never   No Known Allergies No  medications prior to admission.   I have reviewed patient's Past Medical Hx, Surgical Hx, Family Hx, Social Hx, medications and allergies.   ROS:  Review of Systems  Constitutional:  Positive for fatigue. Negative for fever.  HENT:  Negative for congestion and sore throat.   Eyes:  Positive for visual disturbance. Negative for photophobia.  Respiratory:  Negative for cough, chest tightness and shortness of breath.   Cardiovascular:  Negative for chest pain.  Gastrointestinal:  Negative for nausea and vomiting.  Genitourinary:  Positive for pelvic pain.  Neurological:  Positive for headaches. Negative for syncope.  Psychiatric/Behavioral:  The patient is nervous/anxious.    Physical Exam  Patient Vitals for the past 24 hrs:  BP Temp Pulse Resp SpO2 Height Weight  05/17/21 2330 (!) 141/97 -- 77 -- -- -- --  05/17/21 2315 (!) 139/93 -- 89 -- 99 % -- --  05/17/21 2304 (!) 136/93 -- 87 -- -- -- --  05/17/21 2300 (!) 136/93 -- 87 -- 99 % -- --  05/17/21 2250 -- -- -- -- 100 % -- --  05/17/21 2245 (!) 149/97 -- 85 -- 99 % -- --  05/17/21 2233 (!) 148/95 -- 77 -- -- -- --  05/17/21 2231 (!) 150/104 -- 84 -- -- -- --  05/17/21 2230 -- -- -- -- 100 % -- --  05/17/21 2225 -- -- -- -- 99 % -- --  05/17/21 2220 -- -- -- -- 99 % -- --  05/17/21 2216 (!) 145/85 -- 87 -- -- -- --  05/17/21 2215 -- -- -- -- 99 % -- --  05/17/21 2210 -- -- -- -- 100 % -- --  05/17/21 2205 -- -- -- -- 99 % -- --  05/17/21 2200 (!) 143/85 -- 87 -- 99 % -- --  05/17/21 2155 -- -- -- -- 100 % -- --  05/17/21 2151 139/89 -- 83 -- -- -- --  05/17/21 1941 (!) 141/88 98.3 F (36.8 C) (!) 111 17 100 % 5\' 8"  (1.727 m) 256 lb (116.1 kg)   Constitutional: Well-developed, well-nourished female in no acute distress.  Cardiovascular: normal rate & rhythm Respiratory: normal effort GI: Abd soft, non-tender, gravid appropriate for gestational age MS: Extremities nontender, no edema, normal ROM Neurologic: Alert and  oriented x 4.  GU: no CVA tenderness Pelvic: RN checked cervix, no blood on exam  Dilation: 1.5 Effacement (%): 50 Station: -1 Presentation: Vertex Exam by:: Bria Torrence RN (Unchanged from previous exam)   Fetal Tracing: reactive with improved fetal movement during MAU stay Baseline: 140 Variability: moderate Accelerations: 15x15 Decelerations: none Toco: relaxed   Labs: Results for orders placed or performed during the hospital encounter of 05/17/21 (from the past 24 hour(s))  Urinalysis, Routine w reflex microscopic Urine, Clean Catch     Status: None   Collection Time: 05/17/21  8:11 PM  Result Value Ref Range   Color, Urine YELLOW YELLOW   APPearance CLEAR CLEAR   Specific Gravity, Urine 1.010 1.005 - 1.030   pH 6.0 5.0 - 8.0   Glucose, UA NEGATIVE NEGATIVE mg/dL   Hgb urine dipstick NEGATIVE NEGATIVE   Bilirubin Urine NEGATIVE NEGATIVE   Ketones, ur NEGATIVE NEGATIVE mg/dL   Protein, ur NEGATIVE NEGATIVE mg/dL   Nitrite NEGATIVE NEGATIVE   Leukocytes,Ua NEGATIVE NEGATIVE  Protein / creatinine ratio, urine     Status: None   Collection Time: 05/17/21  8:11 PM  Result Value Ref Range   Creatinine, Urine 49.89 mg/dL   Total Protein, Urine 7 mg/dL   Protein Creatinine Ratio 0.14 0.00 - 0.15 mg/mg[Cre]  Glucose, capillary     Status: Abnormal   Collection Time: 05/17/21  8:52 PM  Result Value Ref Range   Glucose-Capillary 120 (H) 70 - 99 mg/dL  CBC     Status: Abnormal   Collection Time: 05/17/21  9:48 PM  Result Value Ref Range   WBC 7.9 4.0 - 10.5 K/uL   RBC 3.62 (L) 3.87 - 5.11 MIL/uL   Hemoglobin 10.5 (L) 12.0 - 15.0 g/dL   HCT 14/12/22 (L) 83.3 - 82.5 %   MCV 88.7 80.0 - 100.0 fL   MCH 29.0 26.0 - 34.0 pg   MCHC 32.7 30.0 - 36.0 g/dL   RDW 05.3 97.6 - 73.4 %   Platelets 254 150 - 400 K/uL   nRBC 0.0 0.0 - 0.2 %  Comprehensive metabolic panel     Status: Abnormal   Collection Time: 05/17/21  9:48 PM  Result Value Ref Range   Sodium 135 135 - 145 mmol/L    Potassium 4.1 3.5 - 5.1 mmol/L   Chloride 107 98 - 111 mmol/L   CO2 19 (L) 22 - 32 mmol/L   Glucose, Bld 115 (H) 70 - 99 mg/dL   BUN 6 6 - 20 mg/dL   Creatinine, Ser 14/12/22 0.44 -  1.00 mg/dL   Calcium 9.0 8.9 - 10.3 mg/dL   Total Protein 6.5 6.5 - 8.1 g/dL   Albumin 2.4 (L) 3.5 - 5.0 g/dL   AST 33 15 - 41 U/L   ALT 18 0 - 44 U/L   Alkaline Phosphatase 110 38 - 126 U/L   Total Bilirubin 0.7 0.3 - 1.2 mg/dL   GFR, Estimated >60 >60 mL/min   Anion gap 9 5 - 15   Imaging:  No results found.  MAU Course: Orders Placed This Encounter  Procedures   Urinalysis, Routine w reflex microscopic Urine, Clean Catch   Glucose, capillary   CBC   Comprehensive metabolic panel   Protein / creatinine ratio, urine   Discharge patient Discharge disposition: 01-Home or Self Care; Discharge patient date: 05/17/2021   Discharge patient   Meds ordered this encounter  Medications   acetaminophen (TYLENOL) tablet 1,000 mg   MDM: Reviewed labs throughout various hospital visits and found two instances of elevated liver enzymes but they have progressively decreased to normal today.  PEC labs normal today with no severe range pressures. Pt complained of a headache while waiting for labs but it was relieved by 1000mg  Tylenol. Blood glucose also normal and NST reactive with improved movement.  Sat with pt and spent some time explaining how DM2 and cHTN affects pregnancy and can affect how she feels physically. Explained our protocol for managing these comorbidities and reassured her that our management would've been the same as what her practice is doing. Encouraged her to return to care and request to discuss delivery plans at next visit. Pt expressed understanding and appreciation for discussion.  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  Assessment: 1. Fetal movement present during pregnancy in third trimester   2. [redacted] weeks gestation of pregnancy   3. Hypoglycemia   4. Chronic hypertension affecting pregnancy    Plan: Discharge home in stable condition with labor precautions.     [facility] [place]. Go to.   Why: as scheduled for ongoing prenatal care Contact information: [place], [place] [address]   [phone]                Allergies as of 05/17/2021   No Known Allergies      Medication List     STOP taking these medications    metroNIDAZOLE 500 MG tablet Commonly known as: FLAGYL       TAKE these medications    metFORMIN 500 MG tablet Commonly known as: GLUCOPHAGE Take by mouth daily with breakfast.   ondansetron 4 MG disintegrating tablet Commonly known as: Zofran ODT Take 1 tablet (4 mg total) by mouth every 8 (eight) hours as needed for nausea or vomiting.   prenatal multivitamin Tabs tablet Take 1 tablet by mouth daily at 12 noon.       Gaylan Gerold, CNM, MSN, [place] Certified Nurse Midwife, [facility]

## 2021-05-20 ENCOUNTER — Inpatient Hospital Stay (HOSPITAL_COMMUNITY)
Admission: AD | Admit: 2021-05-20 | Discharge: 2021-05-21 | Disposition: A | Discharge status: Home / Self Care | Payer: Medicaid Other | Attending: Obstetrics and Gynecology | Admitting: Obstetrics and Gynecology

## 2021-05-20 ENCOUNTER — Encounter (HOSPITAL_COMMUNITY): Payer: Self-pay | Admitting: Obstetrics and Gynecology

## 2021-05-20 ENCOUNTER — Other Ambulatory Visit: Payer: Self-pay

## 2021-05-20 DIAGNOSIS — O24113 Pre-existing diabetes mellitus, type 2, in pregnancy, third trimester: Secondary | ICD-10-CM | POA: Insufficient documentation

## 2021-05-20 DIAGNOSIS — R519 Headache, unspecified: Secondary | ICD-10-CM | POA: Diagnosis not present

## 2021-05-20 DIAGNOSIS — E119 Type 2 diabetes mellitus without complications: Secondary | ICD-10-CM | POA: Insufficient documentation

## 2021-05-20 DIAGNOSIS — O10913 Unspecified pre-existing hypertension complicating pregnancy, third trimester: Secondary | ICD-10-CM | POA: Insufficient documentation

## 2021-05-20 DIAGNOSIS — N898 Other specified noninflammatory disorders of vagina: Secondary | ICD-10-CM | POA: Insufficient documentation

## 2021-05-20 DIAGNOSIS — Z3A36 36 weeks gestation of pregnancy: Secondary | ICD-10-CM | POA: Diagnosis not present

## 2021-05-20 DIAGNOSIS — R102 Pelvic and perineal pain: Secondary | ICD-10-CM | POA: Insufficient documentation

## 2021-05-20 DIAGNOSIS — Z7982 Long term (current) use of aspirin: Secondary | ICD-10-CM | POA: Diagnosis not present

## 2021-05-20 DIAGNOSIS — O26893 Other specified pregnancy related conditions, third trimester: Secondary | ICD-10-CM | POA: Diagnosis not present

## 2021-05-20 DIAGNOSIS — Z7984 Long term (current) use of oral hypoglycemic drugs: Secondary | ICD-10-CM | POA: Insufficient documentation

## 2021-05-20 HISTORY — DX: Essential (primary) hypertension: I10

## 2021-05-20 LAB — CBC
HCT: 33.3 % — ABNORMAL LOW (ref 36.0–46.0)
Hemoglobin: 10.5 g/dL — ABNORMAL LOW (ref 12.0–15.0)
MCH: 28.8 pg (ref 26.0–34.0)
MCHC: 31.5 g/dL (ref 30.0–36.0)
MCV: 91.5 fL (ref 80.0–100.0)
Platelets: 237 10*3/uL (ref 150–400)
RBC: 3.64 MIL/uL — ABNORMAL LOW (ref 3.87–5.11)
RDW: 13.7 % (ref 11.5–15.5)
WBC: 5.3 10*3/uL (ref 4.0–10.5)
nRBC: 0 % (ref 0.0–0.2)

## 2021-05-20 LAB — URINALYSIS, ROUTINE W REFLEX MICROSCOPIC
Bilirubin Urine: NEGATIVE
Glucose, UA: NEGATIVE mg/dL
Hgb urine dipstick: NEGATIVE
Ketones, ur: NEGATIVE mg/dL
Leukocytes,Ua: NEGATIVE
Nitrite: NEGATIVE
Protein, ur: NEGATIVE mg/dL
Specific Gravity, Urine: 1.015 (ref 1.005–1.030)
pH: 6 (ref 5.0–8.0)

## 2021-05-20 LAB — COMPREHENSIVE METABOLIC PANEL
ALT: 29 U/L (ref 0–44)
AST: 43 U/L — ABNORMAL HIGH (ref 15–41)
Albumin: 2.5 g/dL — ABNORMAL LOW (ref 3.5–5.0)
Alkaline Phosphatase: 121 U/L (ref 38–126)
Anion gap: 8 (ref 5–15)
BUN: 6 mg/dL (ref 6–20)
CO2: 21 mmol/L — ABNORMAL LOW (ref 22–32)
Calcium: 8.7 mg/dL — ABNORMAL LOW (ref 8.9–10.3)
Chloride: 105 mmol/L (ref 98–111)
Creatinine, Ser: 0.84 mg/dL (ref 0.44–1.00)
GFR, Estimated: >60 mL/min (ref >60)
Glucose, Bld: 80 mg/dL (ref 70–99)
Potassium: 3.6 mmol/L (ref 3.5–5.1)
Sodium: 134 mmol/L — ABNORMAL LOW (ref 135–145)
Total Bilirubin: 0.4 mg/dL (ref 0.3–1.2)
Total Protein: 6.6 g/dL (ref 6.5–8.1)

## 2021-05-20 LAB — WET PREP, GENITAL
Clue Cells Wet Prep HPF POC: NONE SEEN
Sperm: NONE SEEN
Trich, Wet Prep: NONE SEEN
WBC, Wet Prep HPF POC: <10 (ref <10)
Yeast Wet Prep HPF POC: NONE SEEN

## 2021-05-20 LAB — PROTEIN / CREATININE RATIO, URINE
Creatinine, Urine: 70.2 mg/dL
Protein Creatinine Ratio: 0.09 mg/mg{Cre} (ref 0.00–0.15)
Total Protein, Urine: 6 mg/dL

## 2021-05-20 MED ORDER — LACTATED RINGERS IV BOLUS
1000.0000 mL | Freq: Once | INTRAVENOUS | Status: AC
  Administered 2021-05-20: 1000 mL via INTRAVENOUS

## 2021-05-20 MED ORDER — DIPHENHYDRAMINE HCL 50 MG/ML IJ SOLN
12.5000 mg | Freq: Once | INTRAMUSCULAR | Status: AC
  Administered 2021-05-20: 12.5 mg via INTRAVENOUS
  Filled 2021-05-20: qty 1

## 2021-05-20 MED ORDER — METOCLOPRAMIDE HCL 5 MG/ML IJ SOLN
10.0000 mg | Freq: Once | INTRAMUSCULAR | Status: AC
  Administered 2021-05-20: 10 mg via INTRAVENOUS
  Filled 2021-05-20: qty 2

## 2021-05-20 NOTE — MAU Note (Signed)
Amanda Madden is a 20 y.o. at [redacted]w[redacted]d here in MAU reporting: Has been feeling bad today. Is type 2 diabetic and has had HTN today and having a headache and feeling like her breathing is difficult. Took tylenol but didn't help. Did have some ctx this morning but they eased throughout the day. Having yellow discharge, some spotting, had cervix checked in MAU a few days ago, no LOF.  +FM LMP: 08/23/20 Onset of complaint: 0200 Pain score: 10/10 There were no vitals filed for this visit.   FHT:168 Lab orders placed from triage: urinalysis

## 2021-05-20 NOTE — MAU Provider Note (Signed)
Chief Complaint:  Headache and Hypertension   Event Date/Time   First Provider Initiated Contact with Patient 05/20/21 2209      HPI: Amanda Madden is a 20 y.o. G1P0 at [redacted]w[redacted]d with T2DM and CHTN with care at [facility] in [place] who presents to maternity admissions reporting headache unresolved with Tylenol, episode of contractions earlier this morning, and vaginal discharge and pain.  She reports she woke up at 2:30 am with painful contractions that lasted until 6:30 am. She took a bath and tried to change positions but they did not help. Eventually, the contractions resolved. She reports onset of thick yellow discharge after the contractions. There is no itching/burning with the discharge.  She is feeling normal fetal movement. There is sharp pain in the vagina, different than than the pubic bone pain she has been having.  This vaginal pain is still present.  This afternoon, she developed a headache and tried Tylenol 1000 mg PO which did not help.      Location: headache Quality: aching Severity: 10/10 on pain scale Duration: 5-6 hours Timing: constant Modifying factors: not relieved by Tylenol Associated signs and symptoms: abdominal cramping/vaginal pain  HPI  Past Medical History: Past Medical History:  Diagnosis Date   Diabetes mellitus without complication (HCC)    Hypertension     Past obstetric history: OB History  Gravida Para Term Preterm AB Living  1            SAB IAB Ectopic Multiple Live Births               # Outcome Date GA Lbr Len/2nd Weight Sex Delivery Anes PTL Lv  1 Current             Past Surgical History: History reviewed. No pertinent surgical history.  Family History: History reviewed. No pertinent family history.  Social History: Social History   Tobacco Use   Smoking status: Never   Smokeless tobacco: Never  Substance Use Topics   Alcohol use: Never   Drug use: Never    Allergies: No Known Allergies  Meds:  Medications Prior  to Admission  Medication Sig Dispense Refill Last Dose   aspirin 81 MG chewable tablet Chew by mouth daily.      metFORMIN (GLUCOPHAGE) 500 MG tablet Take by mouth daily with breakfast.   Past Month   ondansetron (ZOFRAN ODT) 4 MG disintegrating tablet Take 1 tablet (4 mg total) by mouth every 8 (eight) hours as needed for nausea or vomiting. 20 tablet 0 Past Month   Prenatal Vit-Fe Fumarate-FA (PRENATAL MULTIVITAMIN) TABS tablet Take 1 tablet by mouth daily at 12 noon.   05/20/2021    ROS:  Review of Systems  Constitutional:  Negative for chills and fever.  Respiratory:  Negative for shortness of breath.   Cardiovascular:  Negative for chest pain.  Gastrointestinal:  Positive for abdominal pain. Negative for nausea and vomiting.  Musculoskeletal:  Negative for back pain.  Neurological:  Positive for headaches. Negative for dizziness.    I have reviewed patient's Past Medical Hx, Surgical Hx, Family Hx, Social Hx, medications and allergies.   Physical Exam  Patient Vitals for the past 24 hrs:  BP Temp Temp src Pulse Resp SpO2 Height Weight  05/21/21 0030 131/89 -- -- 78 -- 100 % -- --  05/21/21 0015 127/88 -- -- 90 -- 99 % -- --  05/21/21 0000 (!) 141/79 -- -- 86 -- 98 % -- --  05/20/21 2345 130/78 -- --  69 -- 99 % -- --  05/20/21 2330 (!) 144/93 -- -- 82 -- 100 % -- --  05/20/21 2315 129/88 -- -- 95 -- 100 % -- --  05/20/21 2245 131/89 -- -- (!) 101 -- -- -- --  05/20/21 2230 130/90 -- -- 94 -- 100 % -- --  05/20/21 2215 (!) 136/94 -- -- (!) 103 -- 100 % -- --  05/20/21 2200 119/86 -- -- 96 -- 100 % -- --  05/20/21 2145 130/83 -- -- 82 -- 99 % -- --  05/20/21 2130 129/88 -- -- 91 -- 99 % -- --  05/20/21 2115 134/84 -- -- (!) 118 -- 99 % -- --  05/20/21 2105 129/90 -- -- (!) 113 -- 99 % -- --  05/20/21 2043 133/90 98.3 F (36.8 C) Oral (!) 114 20 100 % 5\' 8"  (1.727 m) 111.9 kg   Constitutional: Well-developed, well-nourished female in no acute distress.  Cardiovascular:  normal rate Respiratory: normal effort GI: Abd soft, non-tender, gravid appropriate for gestational age.  MS: Extremities nontender, no edema, normal ROM Neurologic: Alert and oriented x 4.  GU: Neg CVAT.  PELVIC EXAM: vaginal cultures collected by blind swab  Dilation: 1.5 Effacement (%): 50 Exam by:: [email or web address], CNM  FHT:  Baseline 135 , moderate variability, accelerations present, no decelerations Contractions: rare, mild to palpation   Labs: Results for orders placed or performed during the hospital encounter of 05/20/21 (from the past 24 hour(s))  Urinalysis, Routine w reflex microscopic     Status: None   Collection Time: 05/20/21  8:50 PM  Result Value Ref Range   Color, Urine YELLOW YELLOW   APPearance CLEAR CLEAR   Specific Gravity, Urine 1.015 1.005 - 1.030   pH 6.0 5.0 - 8.0   Glucose, UA NEGATIVE NEGATIVE mg/dL   Hgb urine dipstick NEGATIVE NEGATIVE   Bilirubin Urine NEGATIVE NEGATIVE   Ketones, ur NEGATIVE NEGATIVE mg/dL   Protein, ur NEGATIVE NEGATIVE mg/dL   Nitrite NEGATIVE NEGATIVE   Leukocytes,Ua NEGATIVE NEGATIVE  Protein / creatinine ratio, urine     Status: None   Collection Time: 05/20/21  8:50 PM  Result Value Ref Range   Creatinine, Urine 70.20 mg/dL   Total Protein, Urine 6 mg/dL   Protein Creatinine Ratio 0.09 0.00 - 0.15 mg/mg[Cre]  Wet prep, genital     Status: None   Collection Time: 05/20/21 10:43 PM   Specimen: Genital  Result Value Ref Range   Yeast Wet Prep HPF POC NONE SEEN NONE SEEN   Trich, Wet Prep NONE SEEN NONE SEEN   Clue Cells Wet Prep HPF POC NONE SEEN NONE SEEN   WBC, Wet Prep HPF POC <10 <10   Sperm NONE SEEN   CBC     Status: Abnormal   Collection Time: 05/20/21 10:43 PM  Result Value Ref Range   WBC 5.3 4.0 - 10.5 K/uL   RBC 3.64 (L) 3.87 - 5.11 MIL/uL   Hemoglobin 10.5 (L) 12.0 - 15.0 g/dL   HCT 05/22/21 (L) 95.6 - 38.7 %   MCV 91.5 80.0 - 100.0 fL   MCH 28.8 26.0 - 34.0 pg   MCHC 31.5 30.0 - 36.0 g/dL   RDW  56.4 33.2 - 95.1 %   Platelets 237 150 - 400 K/uL   nRBC 0.0 0.0 - 0.2 %  Comprehensive metabolic panel     Status: Abnormal   Collection Time: 05/20/21 10:43 PM  Result Value Ref Range   Sodium 134 (  L) 135 - 145 mmol/L   Potassium 3.6 3.5 - 5.1 mmol/L   Chloride 105 98 - 111 mmol/L   CO2 21 (L) 22 - 32 mmol/L   Glucose, Bld 80 70 - 99 mg/dL   BUN 6 6 - 20 mg/dL   Creatinine, Ser 3.08 0.44 - 1.00 mg/dL   Calcium 8.7 (L) 8.9 - 10.3 mg/dL   Total Protein 6.6 6.5 - 8.1 g/dL   Albumin 2.5 (L) 3.5 - 5.0 g/dL   AST 43 (H) 15 - 41 U/L   ALT 29 0 - 44 U/L   Alkaline Phosphatase 121 38 - 126 U/L   Total Bilirubin 0.4 0.3 - 1.2 mg/dL   GFR, Estimated >65 >78 mL/min   Anion gap 8 5 - 15      Imaging:  No results found.  MAU Course/MDM: Orders Placed This Encounter  Procedures   Wet prep, genital   Urinalysis, Routine w reflex microscopic   CBC   Comprehensive metabolic panel   Protein / creatinine ratio, urine   Discharge patient    Meds ordered this encounter  Medications   metoCLOPramide (REGLAN) injection 10 mg   lactated ringers bolus 1,000 mL   diphenhydrAMINE (BENADRYL) injection 12.5 mg   butalbital-acetaminophen-caffeine (FIORICET) 50-325-40 MG per tablet 2 tablet   butalbital-acetaminophen-caffeine (FIORICET) 50-325-40 MG tablet    Sig: Take 1-2 tablets by mouth every 6 (six) hours as needed for headache.    Dispense:  20 tablet    Refill:  0    Order Specific Question:   Supervising Provider    Answer:   Hermina Staggers [[id number]]     NST reviewed and reactive Cervix unchanged from previous exam on 05/17/21 No evidence of preterm labor PT h/a down from 10 to 5 out of 10 after IV fluids, Reglan, Benadryl Fioricet ordered Pelvic/vaginal pain resolved after IV fluids Headache completely resolved after Fioricet Rx for Fioricet for PRN use Message sent to [facility] as pt wishes to transfer Pt to keep appts with her OB provider and transfer this late in pregnancy may  not be possible Pt to f/u with provider about IOL for DM and CHTN   Pt discharge with strict return precautions.    Assessment: 1. Chronic hypertension complicating or reason for care during pregnancy, third trimester   2. Type 2 diabetes mellitus affecting pregnancy in third trimester, antepartum   3. Headache in pregnancy, antepartum, third trimester   4. [redacted] weeks gestation of pregnancy   5. Pelvic pain affecting pregnancy in third trimester, antepartum   6. Vaginal discharge during pregnancy in third trimester     Plan: Discharge home Labor precautions and fetal kick counts  Follow-up Information     [facility] Follow up.   Specialty: Obstetrics and Gynecology Why: Message sent to establish care. Keep appointments with your [facility] provider until established at the new office. Contact information: [address] [place] [address] [phone]        [facility] Follow up.   Specialty: Obstetrics and Gynecology Why: As needed for emergencies Contact information: [address] [id number] [place] [address] [phone]               Allergies as of 05/21/2021   No Known Allergies      Medication List     TAKE these medications    aspirin 81 MG chewable tablet Chew  by mouth daily.   butalbital-acetaminophen-caffeine 50-325-40 MG tablet Commonly known as: FIORICET Take 1-2 tablets by mouth every 6 (six) hours as needed for headache.   metFORMIN 500 MG tablet Commonly known as: GLUCOPHAGE Take by mouth daily with breakfast.   ondansetron 4 MG disintegrating tablet Commonly known as: Zofran ODT Take 1 tablet (4 mg total) by mouth every 8 (eight) hours as needed for nausea or vomiting.   prenatal multivitamin Tabs tablet Take 1 tablet by mouth daily at 12 noon.        Sharen Counter Certified  Nurse-Midwife 05/21/2021 12:45 AM

## 2021-05-21 DIAGNOSIS — Z3A36 36 weeks gestation of pregnancy: Secondary | ICD-10-CM

## 2021-05-21 DIAGNOSIS — O24113 Pre-existing diabetes mellitus, type 2, in pregnancy, third trimester: Secondary | ICD-10-CM

## 2021-05-21 DIAGNOSIS — R519 Headache, unspecified: Secondary | ICD-10-CM

## 2021-05-21 DIAGNOSIS — O26893 Other specified pregnancy related conditions, third trimester: Secondary | ICD-10-CM

## 2021-05-21 DIAGNOSIS — O10913 Unspecified pre-existing hypertension complicating pregnancy, third trimester: Secondary | ICD-10-CM

## 2021-05-21 DIAGNOSIS — R102 Pelvic and perineal pain: Secondary | ICD-10-CM

## 2021-05-21 LAB — GC/CHLAMYDIA PROBE AMP (~~LOC~~) NOT AT ARMC
Chlamydia: NEGATIVE
Comment: NEGATIVE
Comment: NORMAL
Neisseria Gonorrhea: NEGATIVE

## 2021-05-21 MED ORDER — BUTALBITAL-APAP-CAFFEINE 50-325-40 MG PO TABS: 1.0000 | ORAL_TABLET | Freq: Four times a day (QID) | ORAL | 0 refills | Status: AC | PRN

## 2021-05-21 MED ORDER — BUTALBITAL-APAP-CAFFEINE 50-325-40 MG PO TABS
2.0000 | ORAL_TABLET | Freq: Once | ORAL | Status: AC
  Administered 2021-05-21: 2 via ORAL
  Filled 2021-05-21: qty 2

## 2021-05-21 NOTE — MAU Note (Signed)
Patient signed physical AVS copy d/t E-signature malfunction in room.

## 2022-11-19 IMAGING — DX DG CHEST 1V PORT
1 series · 1 of 1 positions shown · non-contrast
Comparison: None.

CLINICAL DATA: Short of breath and fever

EXAM:
PORTABLE CHEST 1 VIEW

[chest ap]
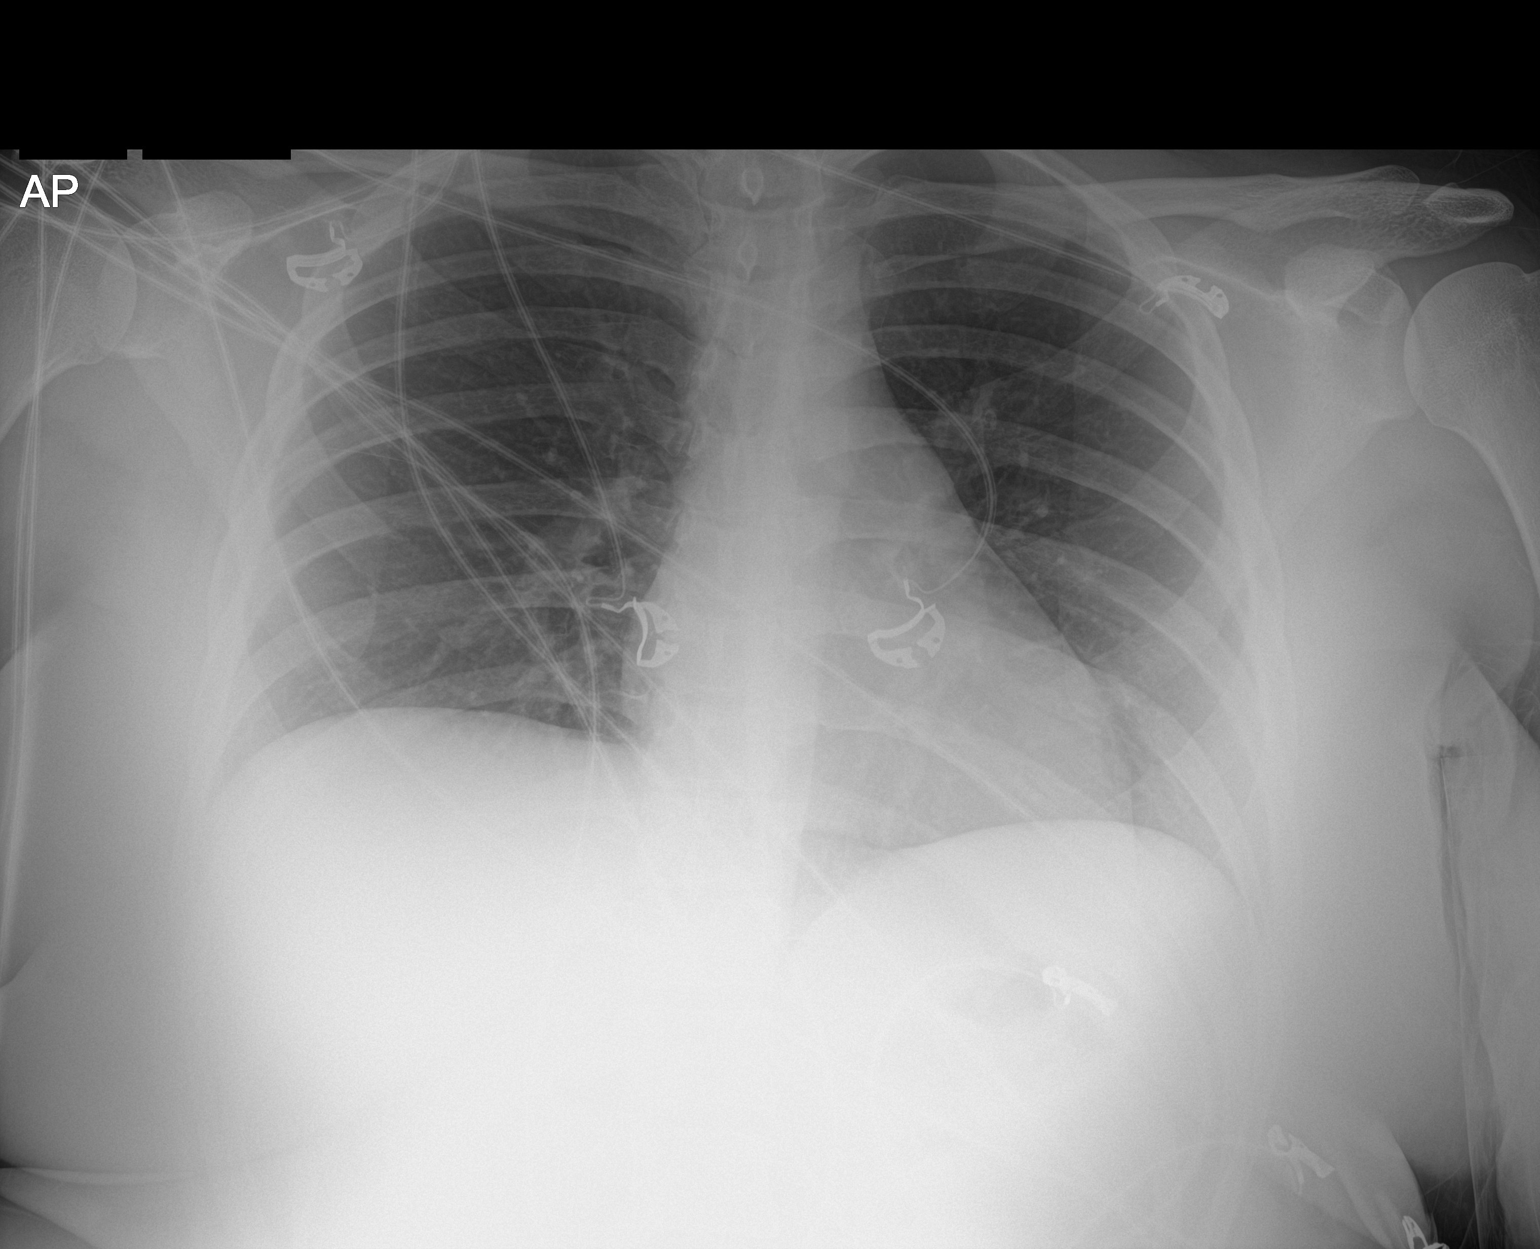

[1 of 1 positions shown; findings below may reference images not displayed]

FINDINGS: The heart size and mediastinal contours are within normal limits.
Both lungs are clear. The visualized skeletal structures are
unremarkable.
IMPRESSION: No active disease.

## 2022-11-19 IMAGING — US US ABDOMEN LIMITED RUQ/ASCITES
1 series · 14 of 25 positions shown · non-contrast
Comparison: None.

CLINICAL DATA: Nausea and vomiting with abdominal pain x3 days

EXAM:
ULTRASOUND ABDOMEN LIMITED RIGHT UPPER QUADRANT

[Series 1: us abdomen limited ruq/ascites · 14 of 32 slices shown]
[im 1/32]
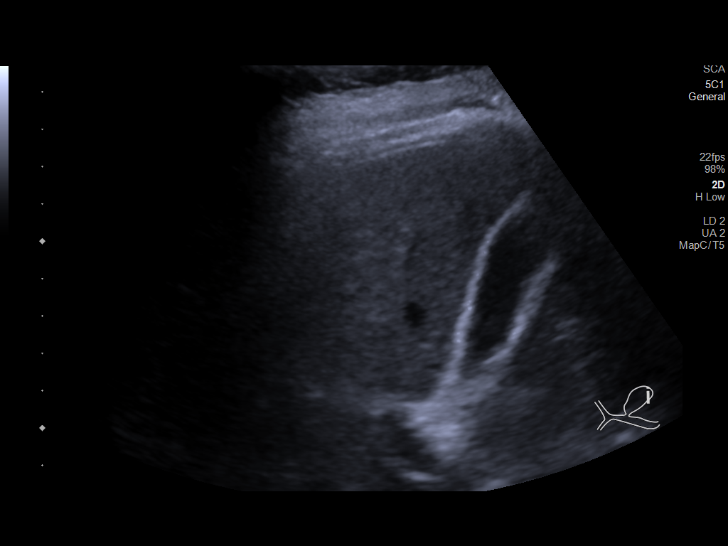
[im 3/32]
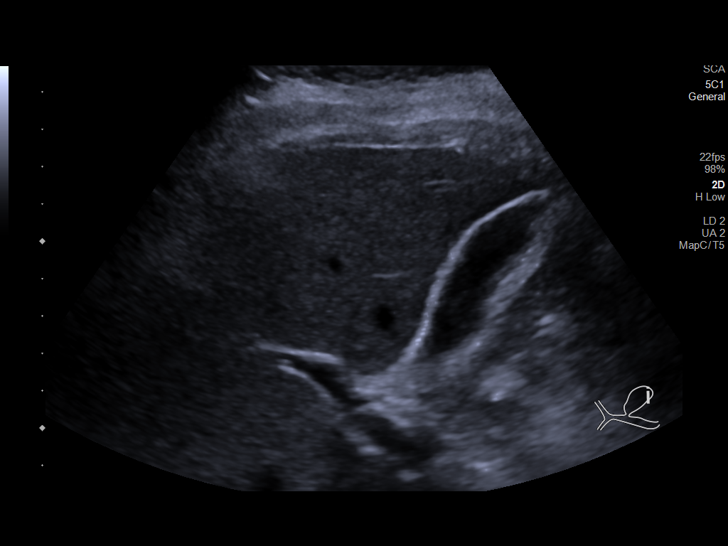
[im 6/32]
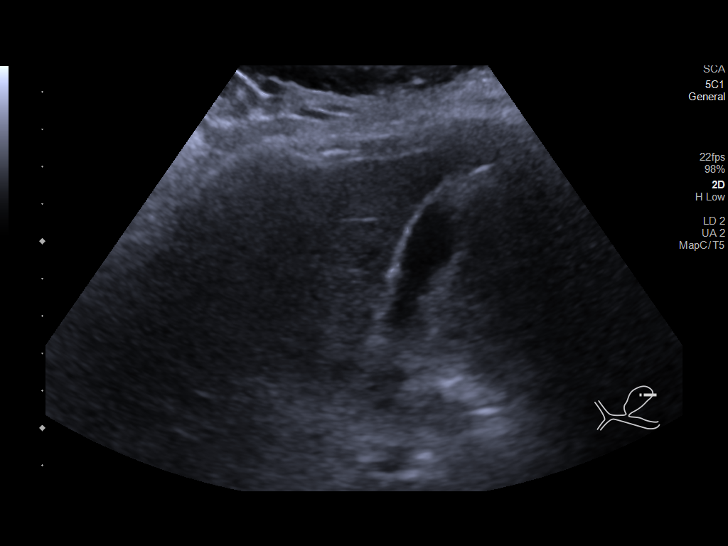
[im 8/32]
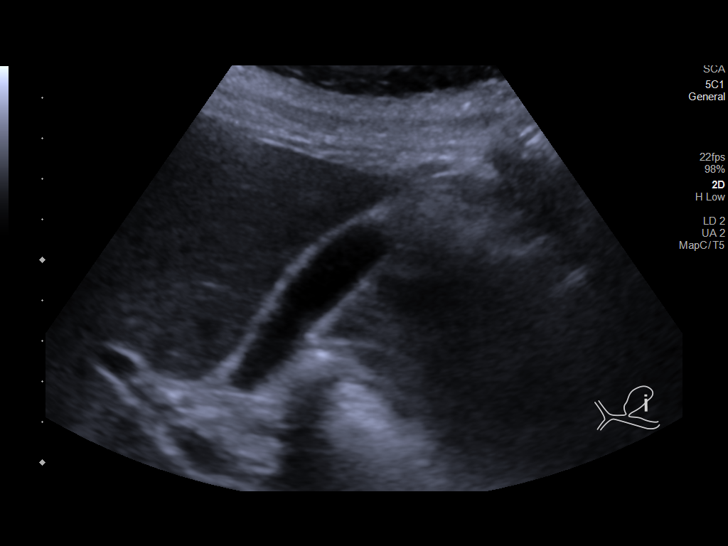
[im 11/32]
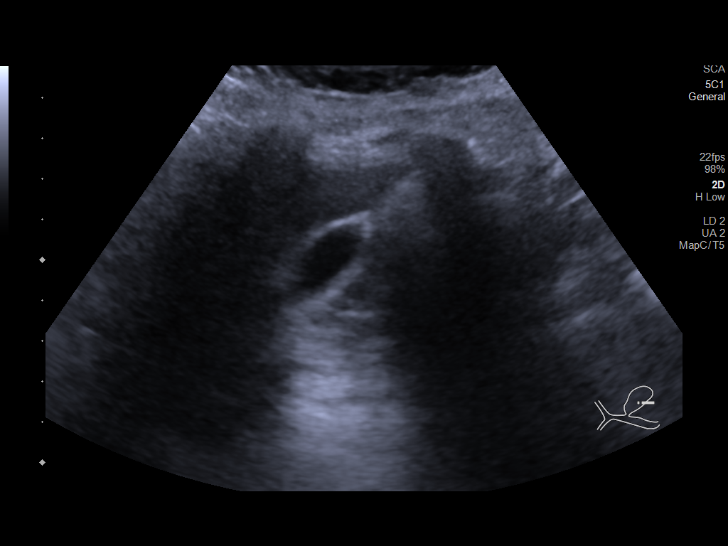
[im 12/32]
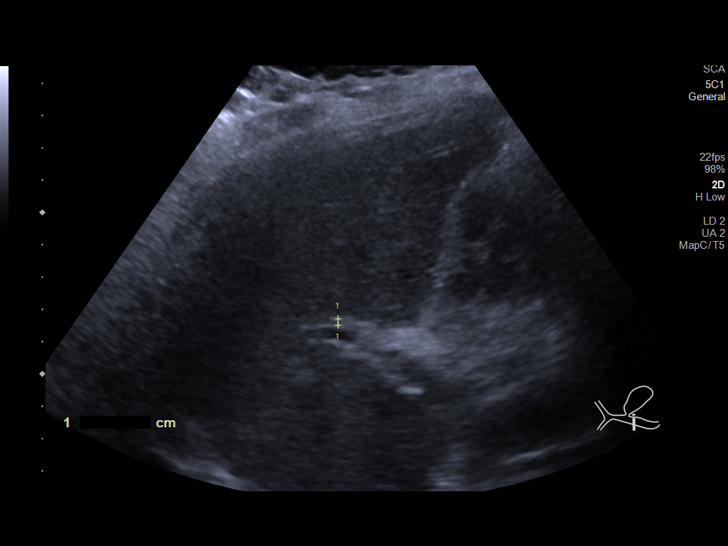
[im 15/32]
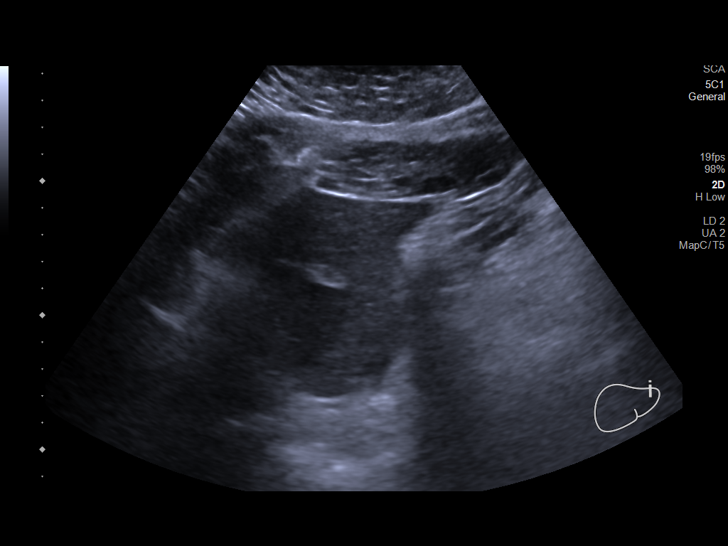
[im 17/32]
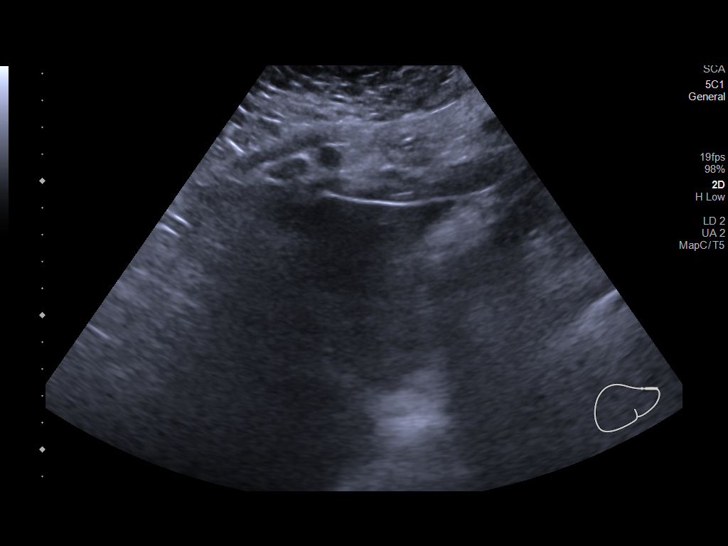
[im 20/32]
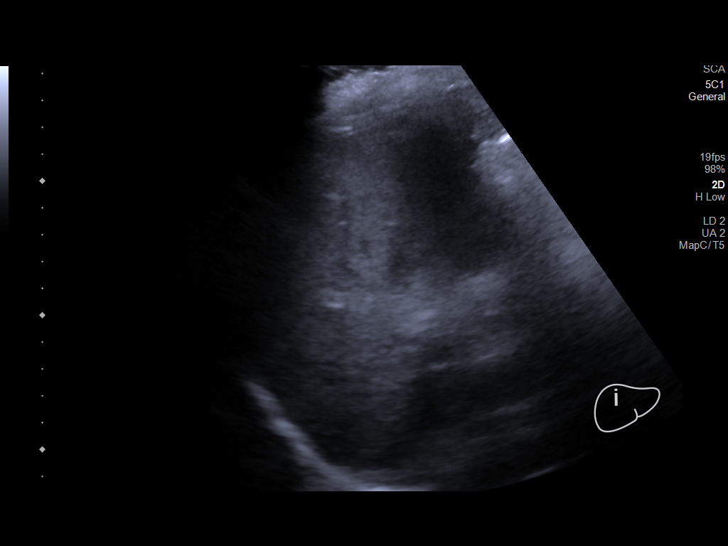
[im 21/32]
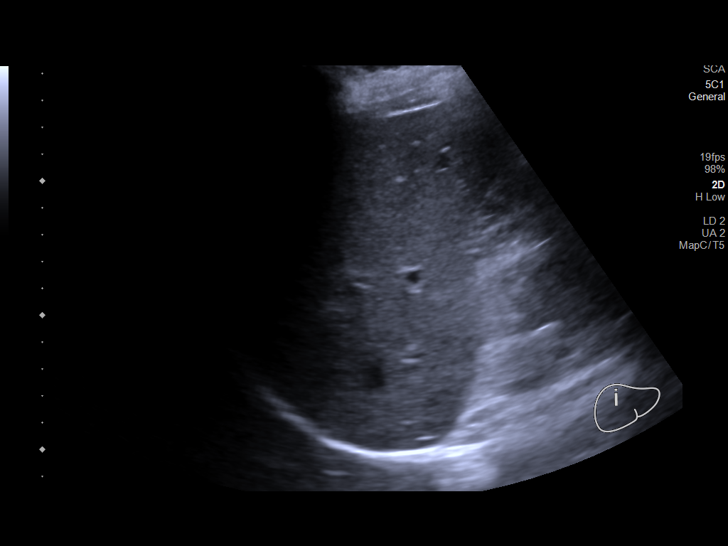
[im 24/32]
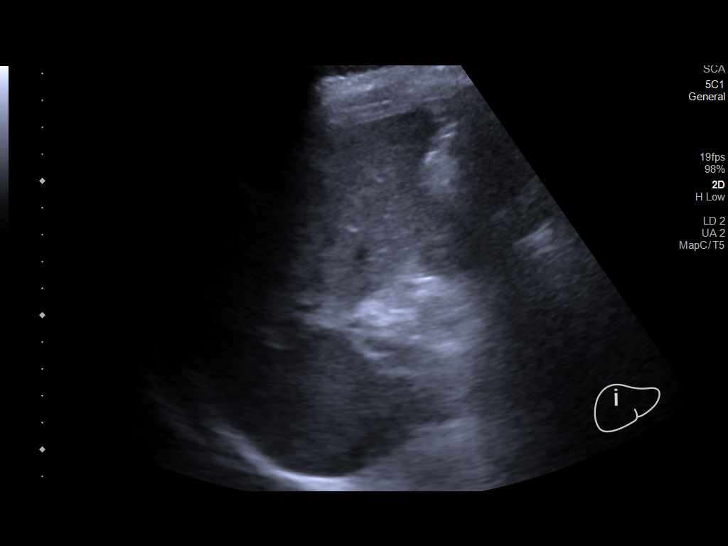
[im 26/32]
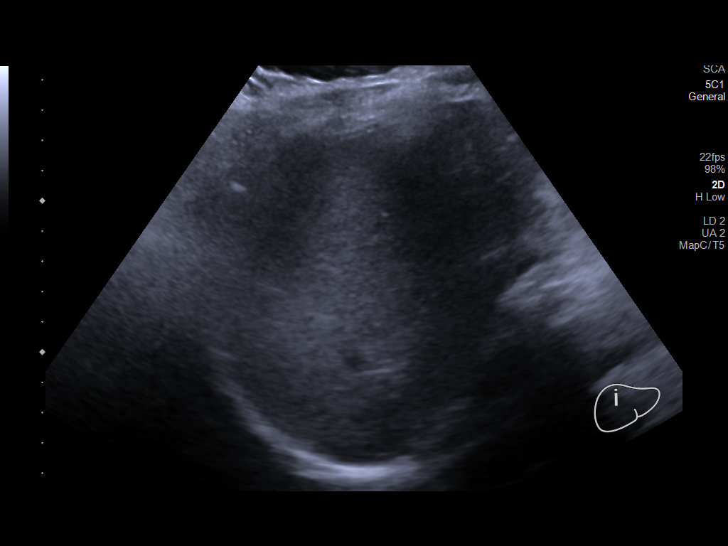
[im 29/32]
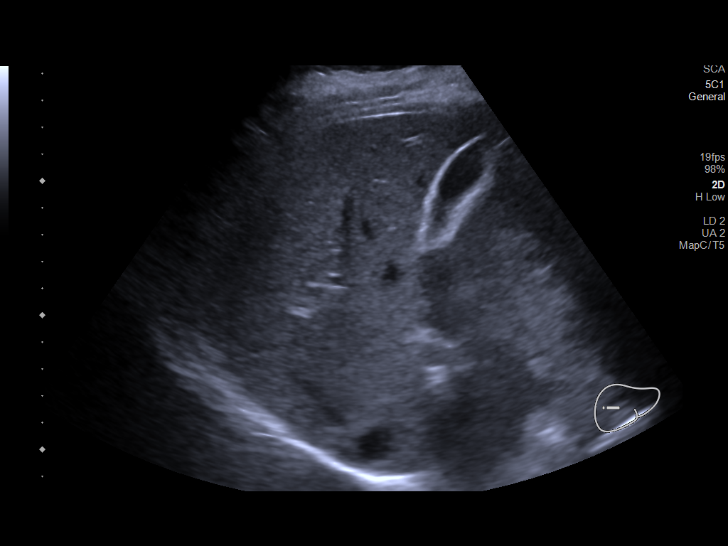
[im 32/32]
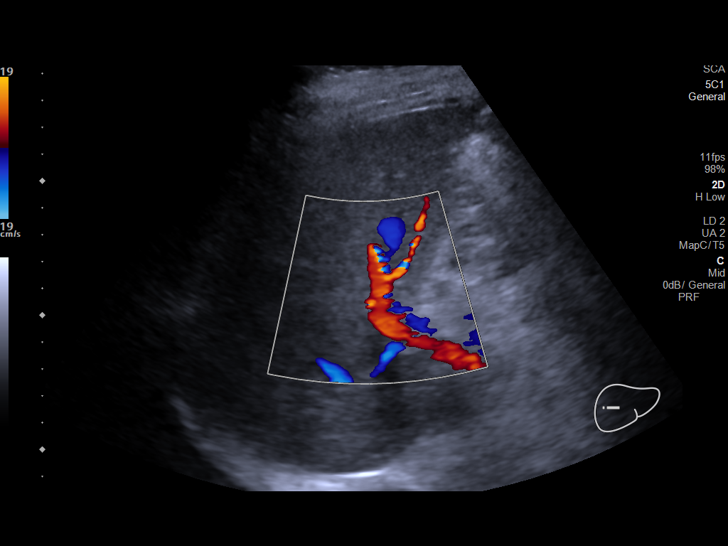

[14 of 25 positions shown; findings below may reference images not displayed]

FINDINGS: Gallbladder:

No gallstones or wall thickening visualized. No sonographic Murphy
sign noted by sonographer.

Common bile duct:

Diameter: 2 mm

Liver:

No focal lesion identified. Within normal limits in parenchymal
echogenicity. Portal vein is patent on color Doppler imaging with
normal direction of blood flow towards the liver.

Other: None.
IMPRESSION: Unremarkable right upper quadrant ultrasound.

## 2023-01-06 IMAGING — US US OB < 14 WEEKS - US OB TV
1 series · 14 of 28 positions shown · non-contrast
Comparison: None.

CLINICAL DATA: Pelvic pain

EXAM:
OBSTETRIC <14 WK US AND TRANSVAGINAL OB US
TECHNIQUE: Both transabdominal and transvaginal ultrasound examinations were
performed for complete evaluation of the gestation as well as the
maternal uterus, adnexal regions, and pelvic cul-de-sac.
Transvaginal technique was performed to assess early pregnancy.

[Series 1: us ob < 14 weeks - us ob tv · 14 of 46 slices shown]
[im 2/46]
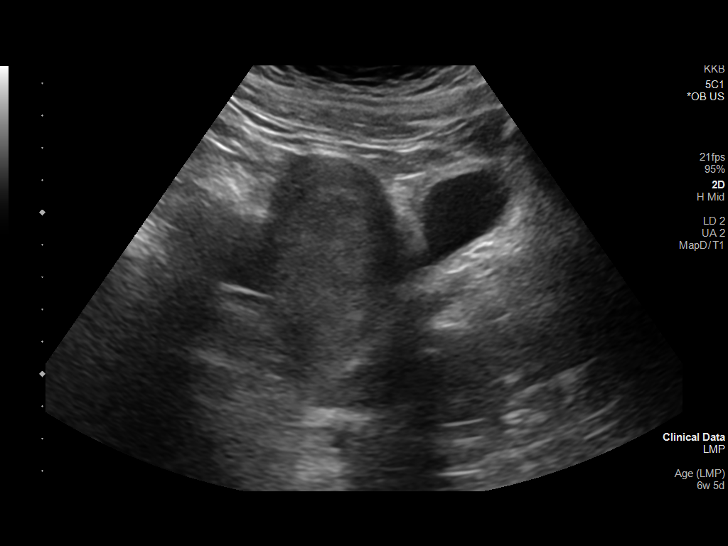
[im 6/46]
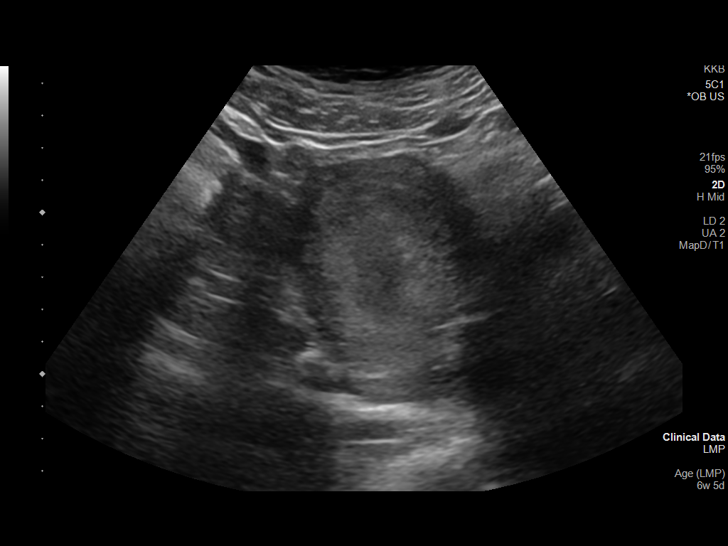
[im 9/46]
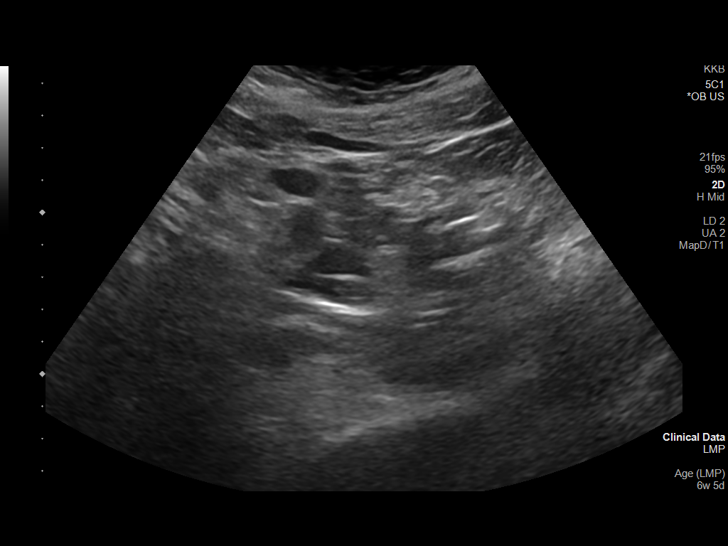
[im 12/46]
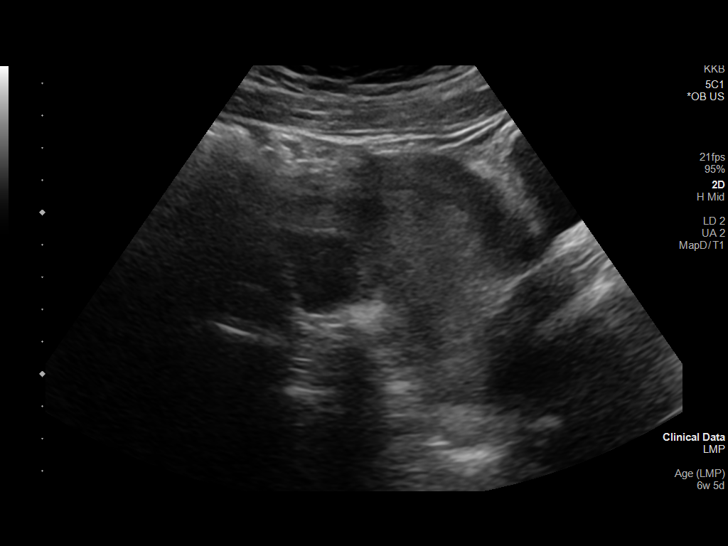
[im 16/46]
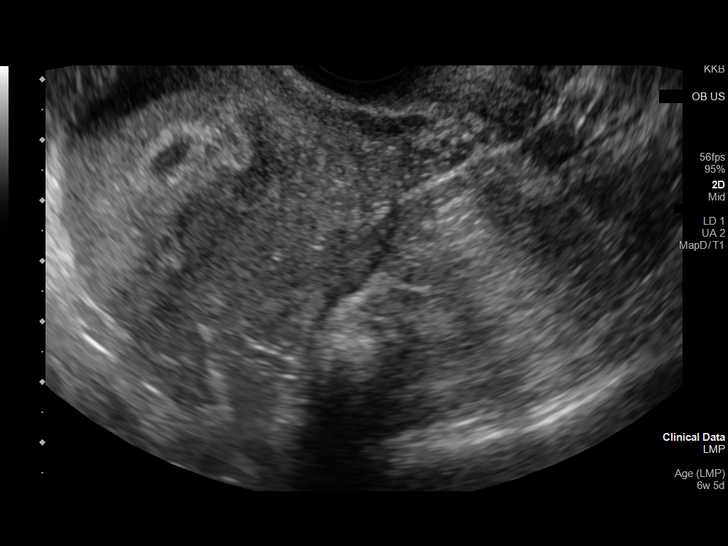
[im 19/46]
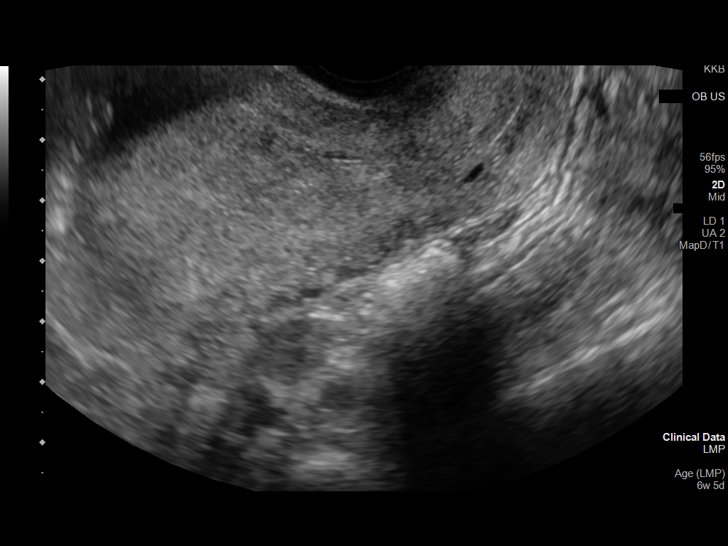
[im 22/46]
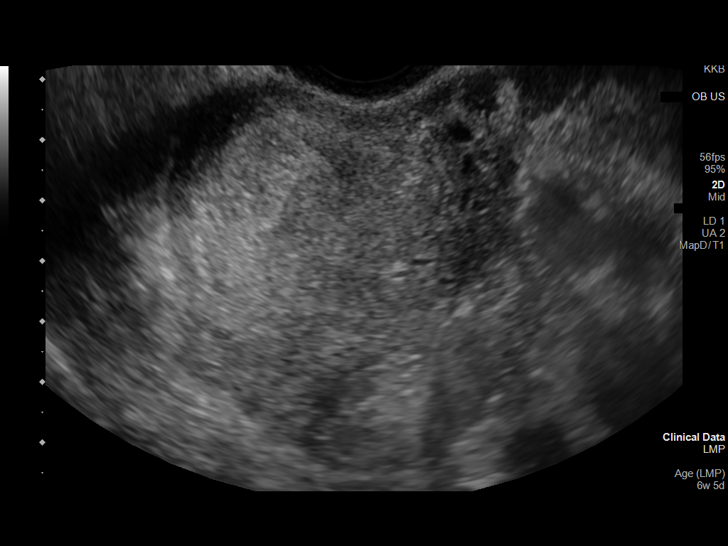
[im 26/46]
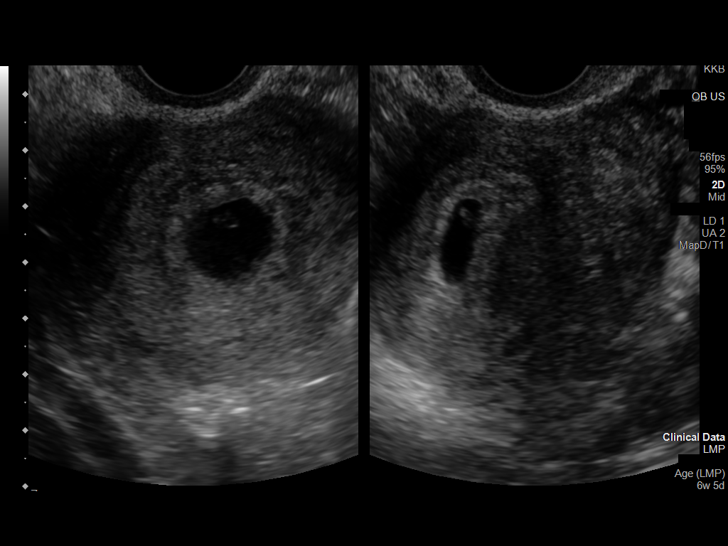
[im 29/46]
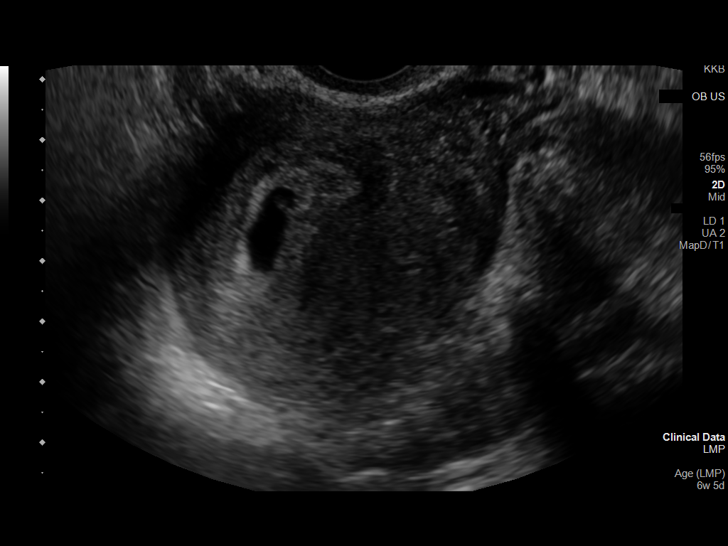
[im 32/46]
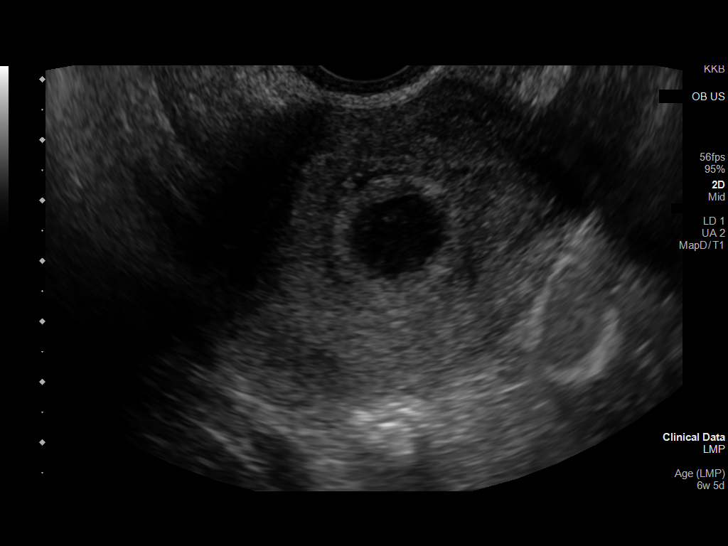
[im 36/46]
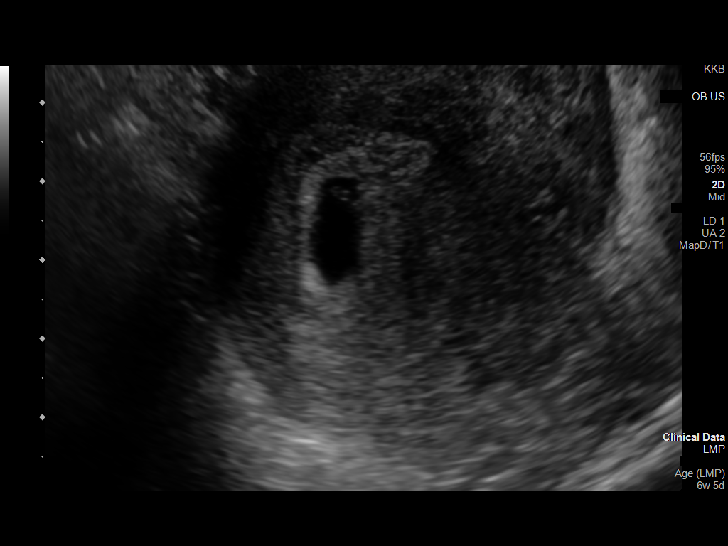
[im 39/46]
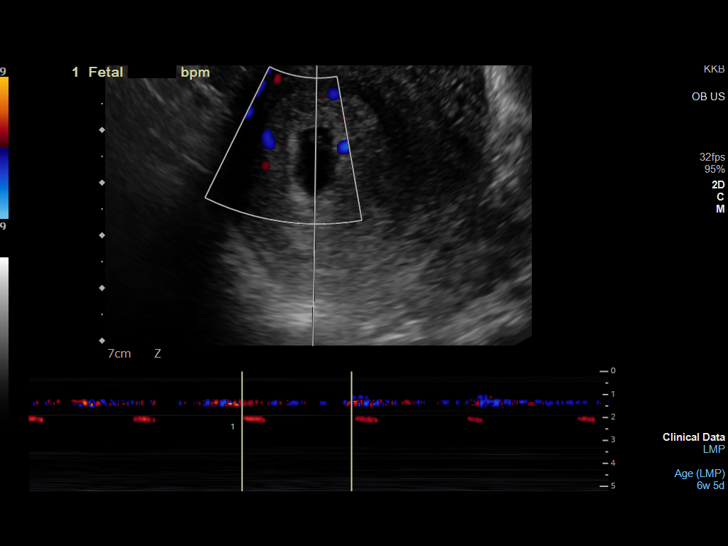
[im 42/46]
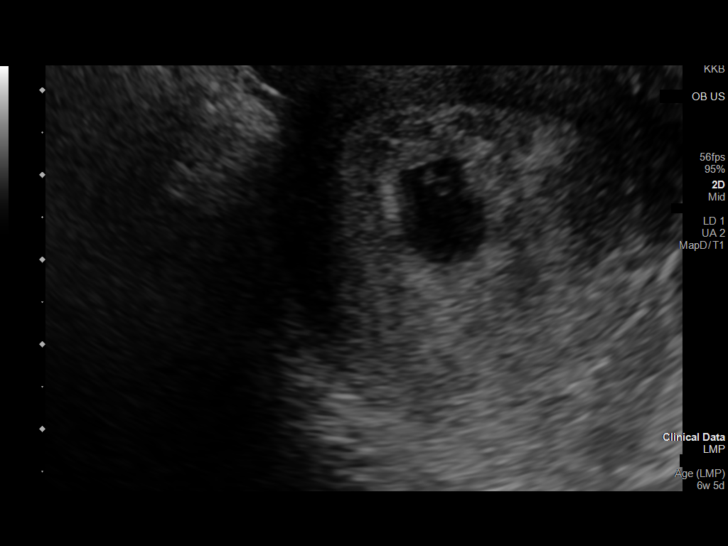
[im 46/46]
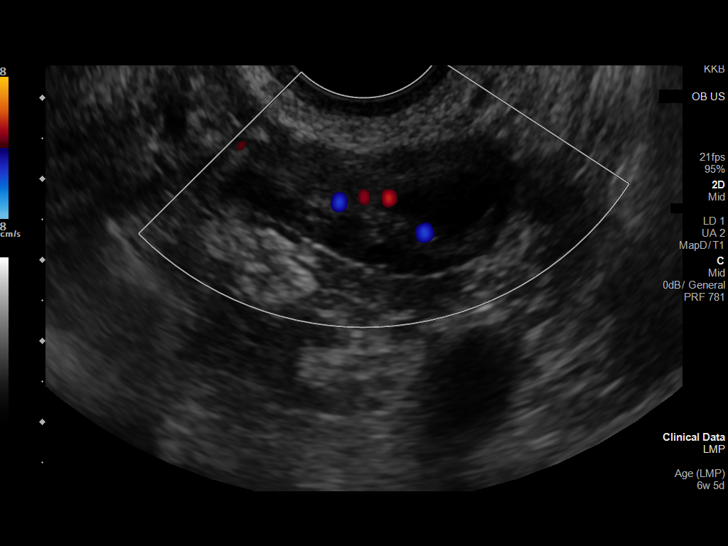

[14 of 28 positions shown; findings below may reference images not displayed]

FINDINGS: Intrauterine gestational sac: Single

Yolk sac:  Visualized

Embryo:  Visualized

Cardiac Activity: Visualized

Heart Rate: 93 bpm

CRL: 4 mm   6 w   1 d                  US EDC: 06/08/2020

Subchorionic hemorrhage:  None visualized.

Maternal uterus/adnexae: Ovaries are within normal limits. No free
fluid.
IMPRESSION: 1. Single intrauterine pregnancy as above.
2. Slight fetal bradycardia.

## 2023-04-21 ENCOUNTER — Other Ambulatory Visit: Payer: Self-pay

## 2023-04-21 ENCOUNTER — Encounter (HOSPITAL_COMMUNITY): Payer: Self-pay | Admitting: Emergency Medicine

## 2023-04-21 ENCOUNTER — Emergency Department (HOSPITAL_COMMUNITY): Payer: Medicaid Other

## 2023-04-21 ENCOUNTER — Emergency Department (HOSPITAL_COMMUNITY)
Admission: EM | Admit: 2023-04-21 | Discharge: 2023-04-21 | Disposition: A | Discharge status: Against Medical Advice | Payer: Worker's Compensation | Attending: Emergency Medicine | Admitting: Emergency Medicine

## 2023-04-21 DIAGNOSIS — M25561 Pain in right knee: Secondary | ICD-10-CM | POA: Diagnosis present

## 2023-04-21 DIAGNOSIS — Z5321 Procedure and treatment not carried out due to patient leaving prior to being seen by health care provider: Secondary | ICD-10-CM | POA: Insufficient documentation

## 2023-04-21 NOTE — ED Triage Notes (Addendum)
Patient presents due to knee pain. He injured her right knee while in the bathroom at work. She reports popping her knee out of place. Patient reduced knee cap back . She went to urgent care yesterday and she was placed on restrictions. This morning she woke with excruciating pain. She believe there is joint instability. Urgent care told her to come here.

## 2024-05-07 ENCOUNTER — Encounter: Payer: Self-pay | Admitting: *Deleted

## 2024-05-09 ENCOUNTER — Ambulatory Visit: Attending: Internal Medicine | Admitting: Cardiology
